# Patient Record
Sex: Female | Born: 1970 | Race: White | Hispanic: No | Marital: Single | Smoking: Former smoker
Health system: Southern US, Community
[De-identification: ages and names within clinical notes are randomized; demographics above are authoritative.]

## PROBLEM LIST (undated history)

## (undated) DIAGNOSIS — N926 Irregular menstruation, unspecified: Secondary | ICD-10-CM

## (undated) DIAGNOSIS — Z8679 Personal history of other diseases of the circulatory system: Secondary | ICD-10-CM

## (undated) DIAGNOSIS — J302 Other seasonal allergic rhinitis: Secondary | ICD-10-CM

## (undated) HISTORY — DX: Other seasonal allergic rhinitis: J30.2

## (undated) HISTORY — PX: LAPAROSCOPY: SHX197

## (undated) HISTORY — DX: Personal history of other diseases of the circulatory system: Z86.79

## (undated) HISTORY — DX: Irregular menstruation, unspecified: N92.6

## (undated) HISTORY — PX: OTHER SURGICAL HISTORY: SHX169

---

## 2004-11-15 ENCOUNTER — Emergency Department (HOSPITAL_COMMUNITY): Admission: EM | Admit: 2004-11-15 | Discharge: 2004-11-16 | Payer: Self-pay | Admitting: *Deleted

## 2006-10-26 ENCOUNTER — Ambulatory Visit (HOSPITAL_COMMUNITY): Admission: RE | Admit: 2006-10-26 | Discharge: 2006-10-26 | Payer: Self-pay | Admitting: Family Medicine

## 2006-10-26 IMAGING — US US OB DETAIL+14 WK
1 series · 14 of 28 positions shown · non-contrast
Comparison: none

OBSTETRICAL ULTRASOUND:
 This ultrasound was performed in The [HOSPITAL], and the [facility] report will be stored to [REDACTED] PACS.

[Series 1: us ob detail+14 wk · 14 of 37 slices shown]
[im 2/37]
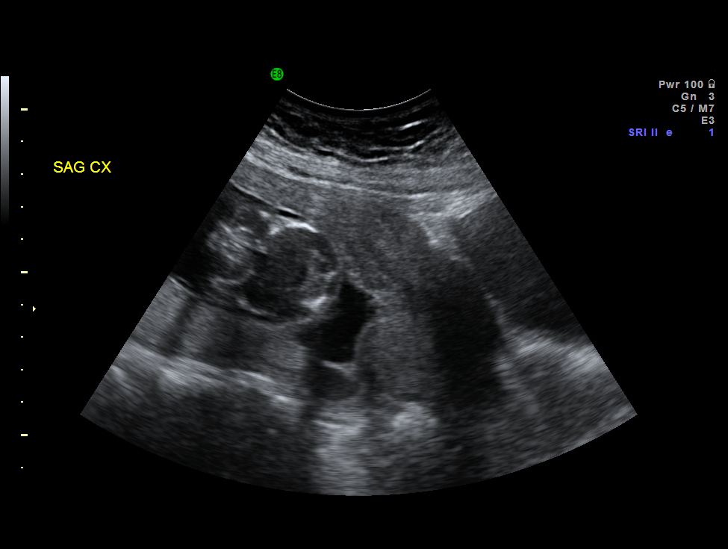
[im 5/37]
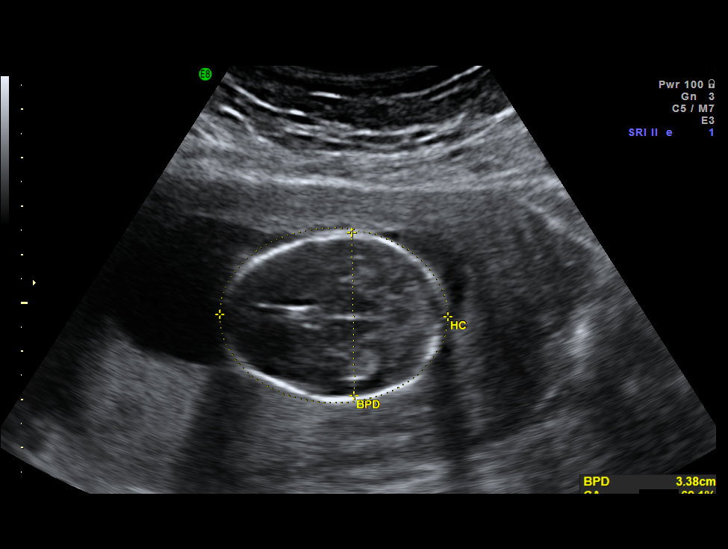
[im 7/37]
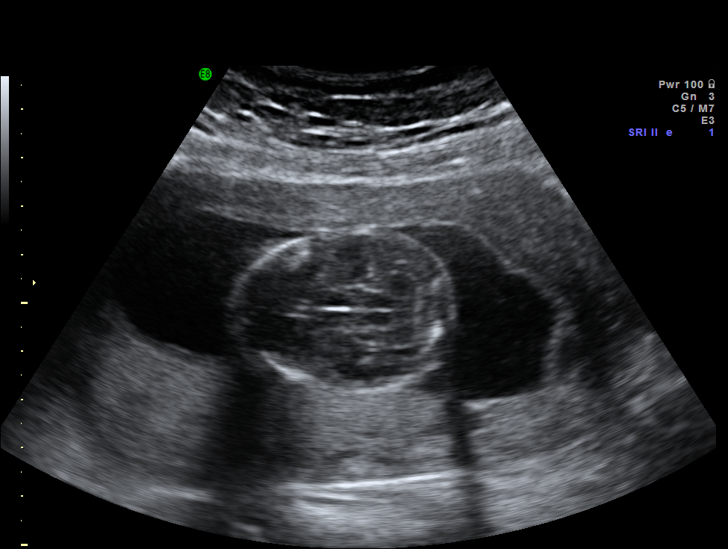
[im 10/37]
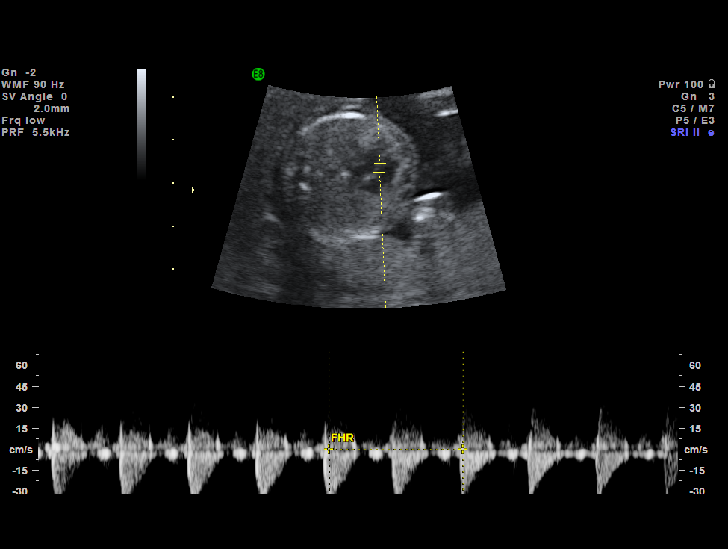
[im 13/37]
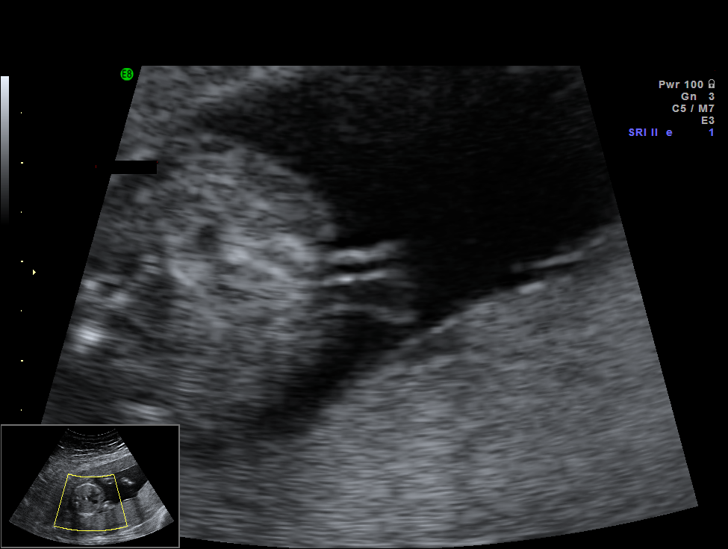
[im 15/37]
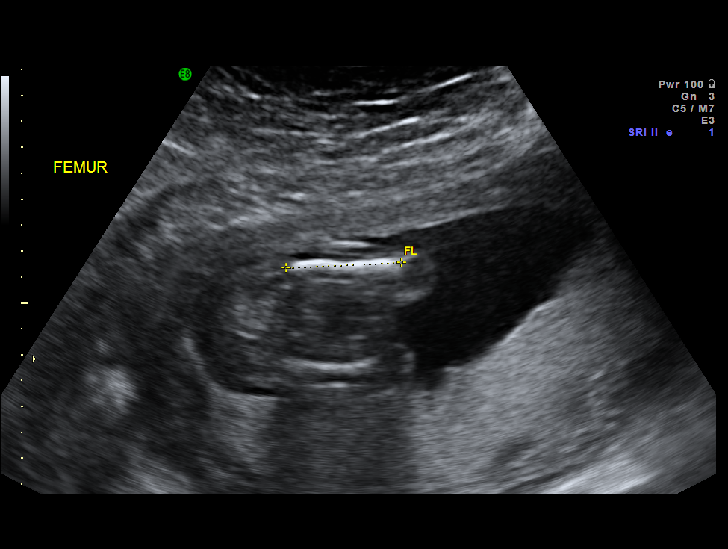
[im 18/37]
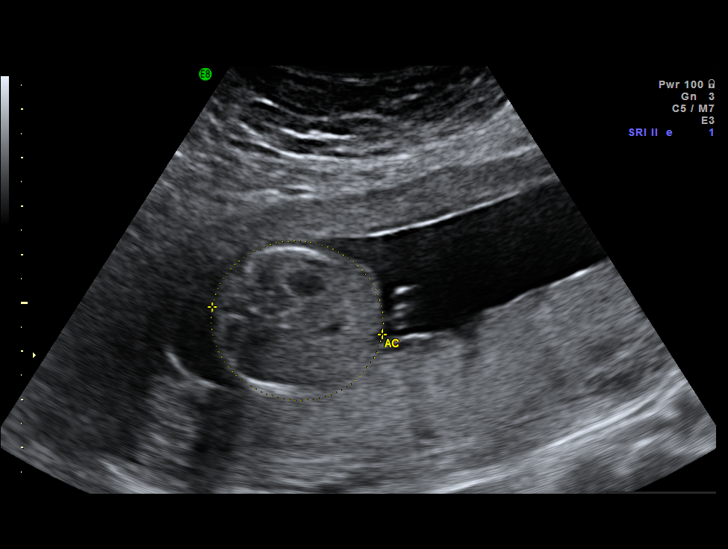
[im 21/37]
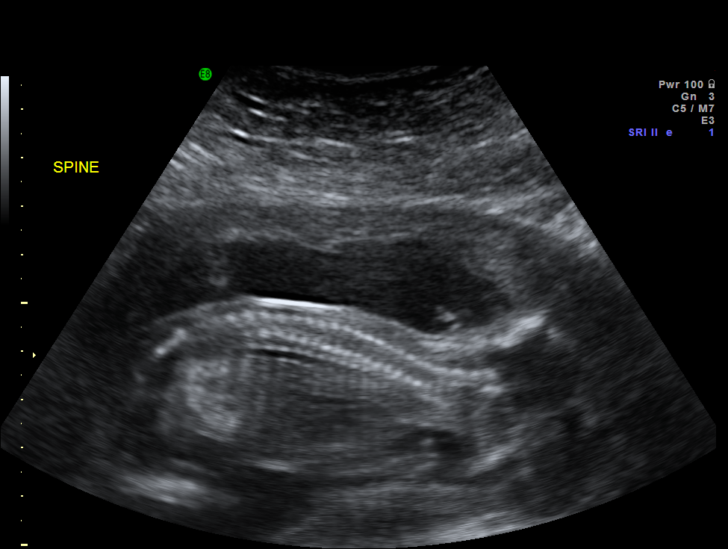
[im 23/37]
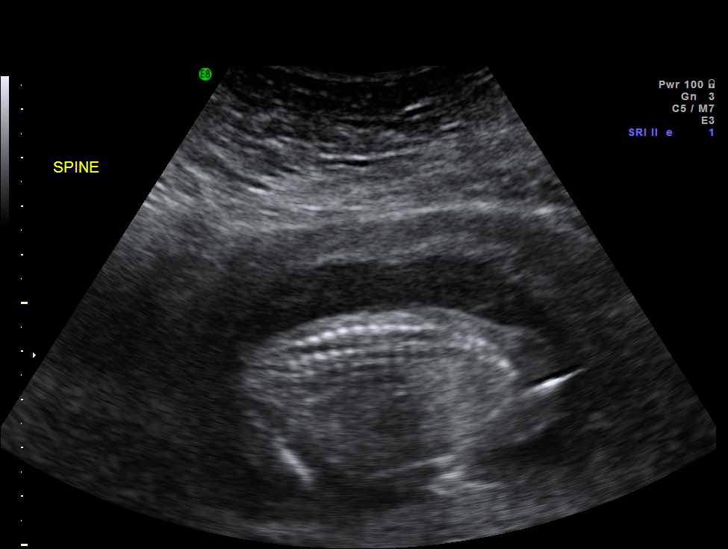
[im 26/37]
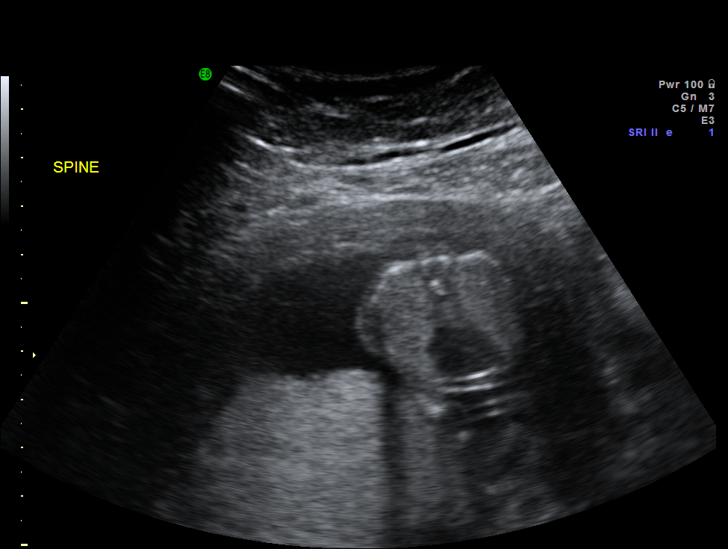
[im 29/37]
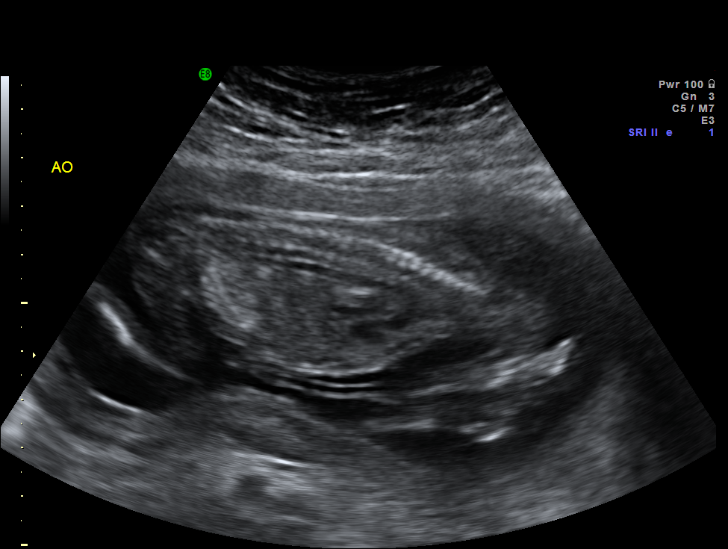
[im 31/37]
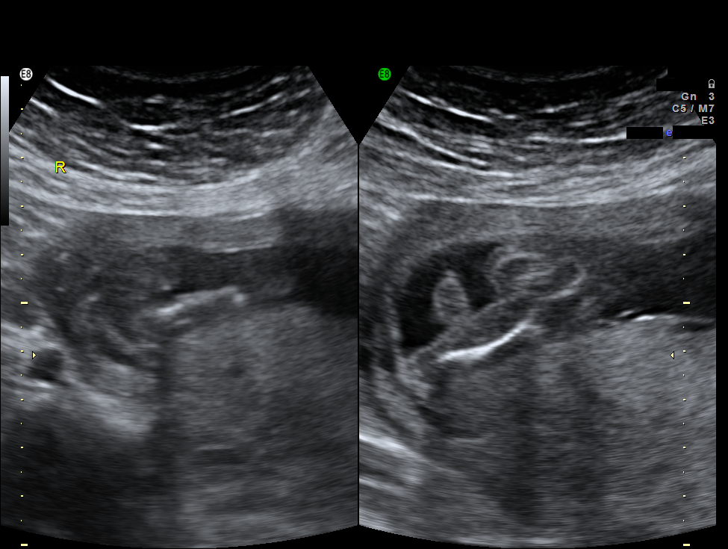
[im 34/37]
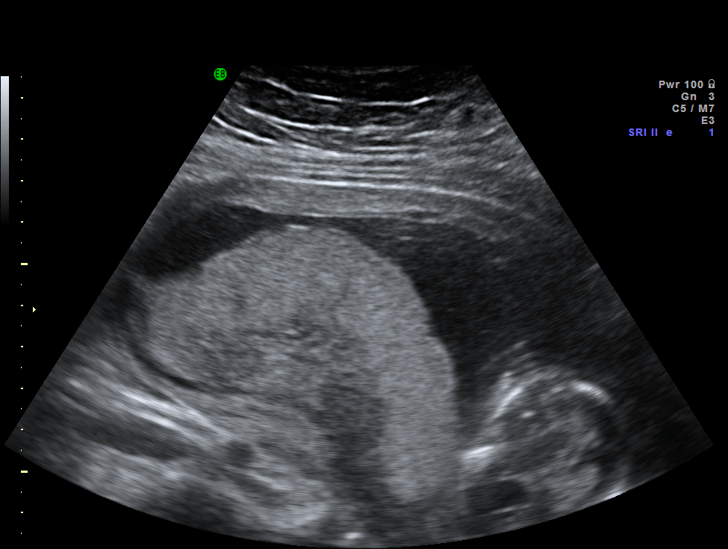
[im 37/37]
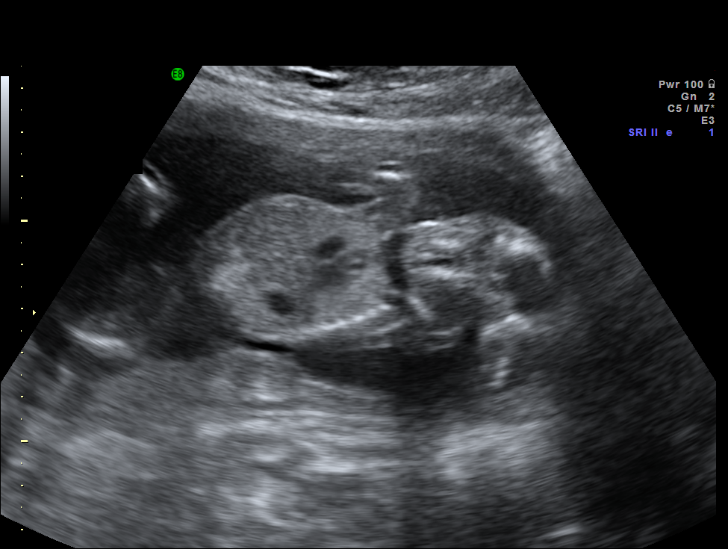

[14 of 28 positions shown; findings below may reference images not displayed]

IMPRESSION: The [facility] report has also been faxed to the ordering physician.

## 2006-11-16 ENCOUNTER — Ambulatory Visit (HOSPITAL_COMMUNITY): Admission: RE | Admit: 2006-11-16 | Discharge: 2006-11-16 | Payer: Self-pay | Admitting: Family Medicine

## 2006-11-16 IMAGING — US US OB FOLLOW-UP
1 series · 14 of 28 positions shown · non-contrast
Comparison: none

OBSTETRICAL ULTRASOUND:
 This ultrasound was performed in The [HOSPITAL], and the [facility] report will be stored to [REDACTED] PACS.

[Series 1: us ob follow-up · 14 of 96 slices shown]
[im 4/96]
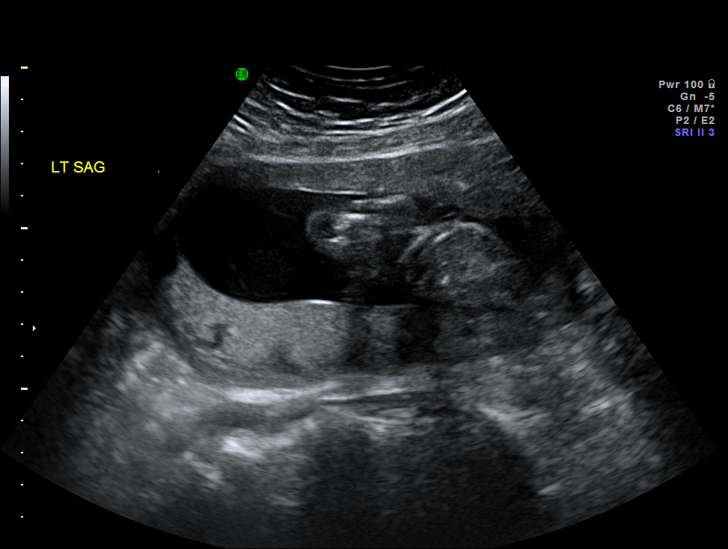
[im 11/96]
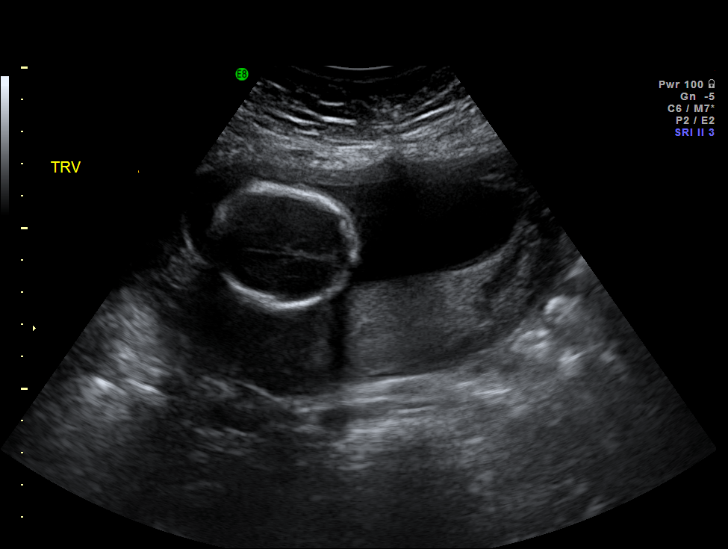
[im 18/96]
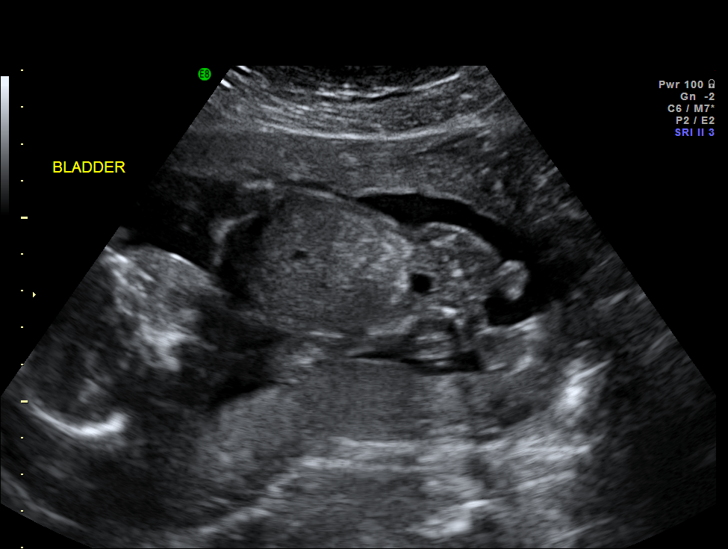
[im 25/96]
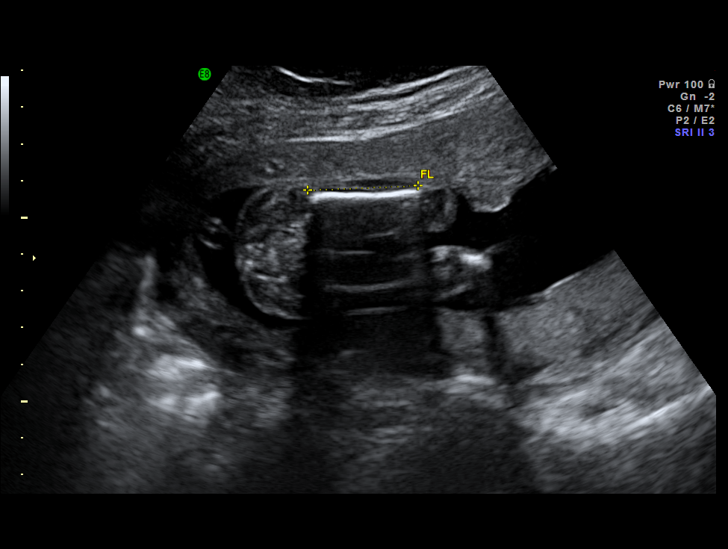
[im 32/96]
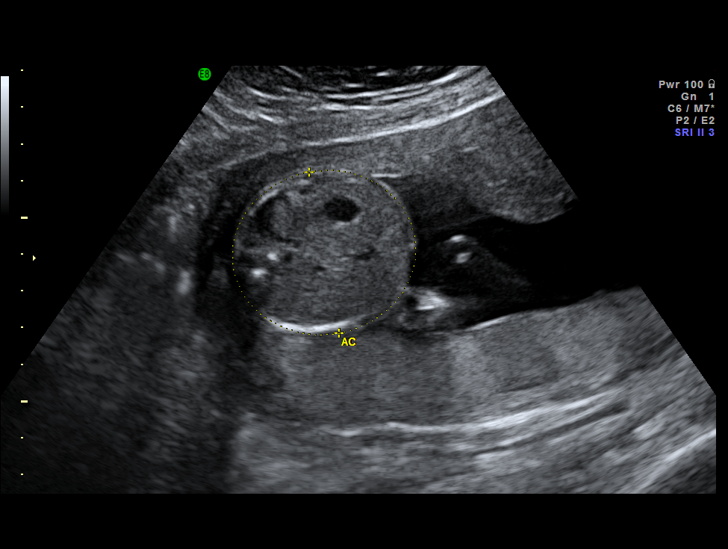
[im 39/96]
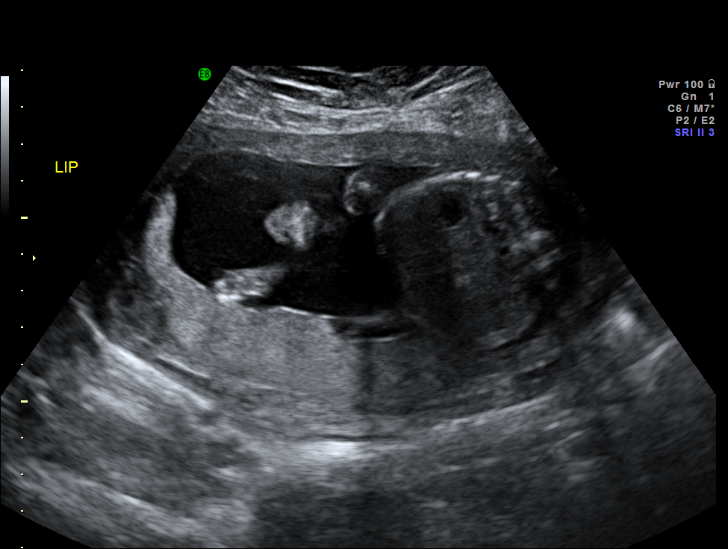
[im 46/96]
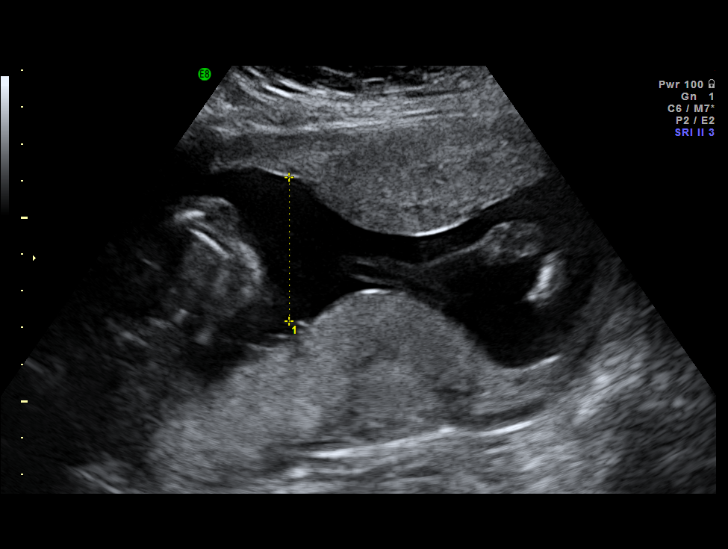
[im 53/96]
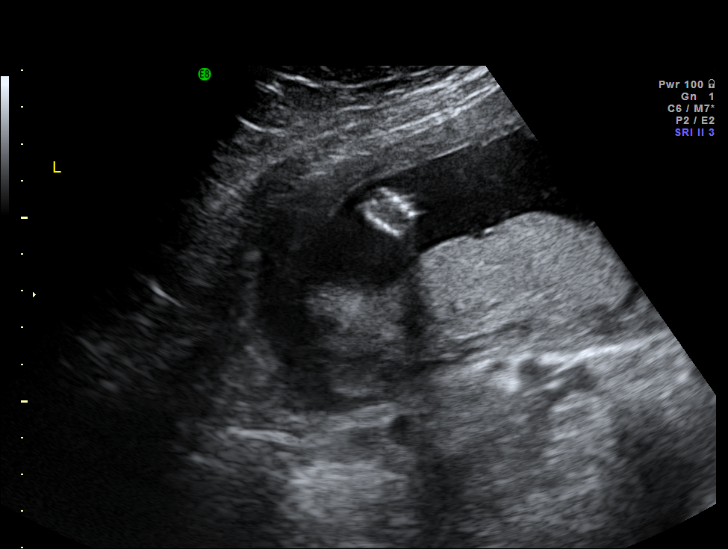
[im 60/96]
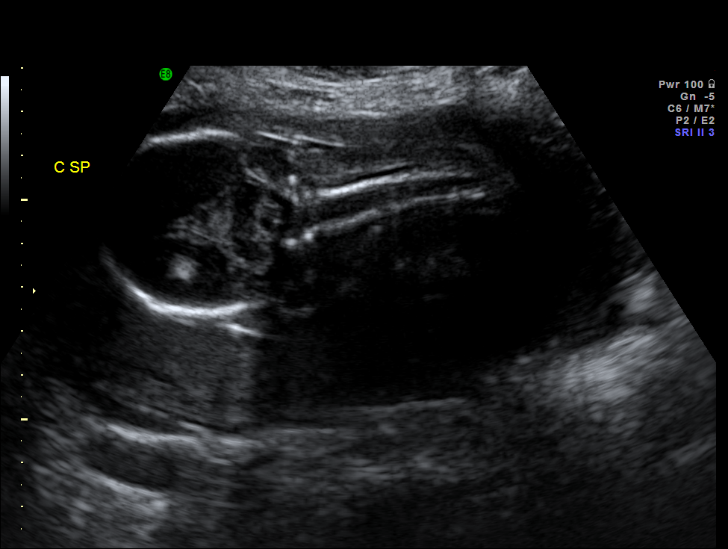
[im 67/96]
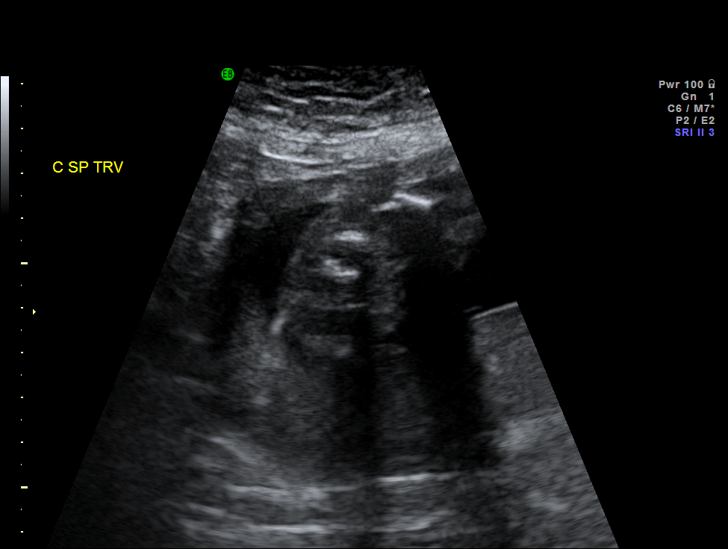
[im 74/96]
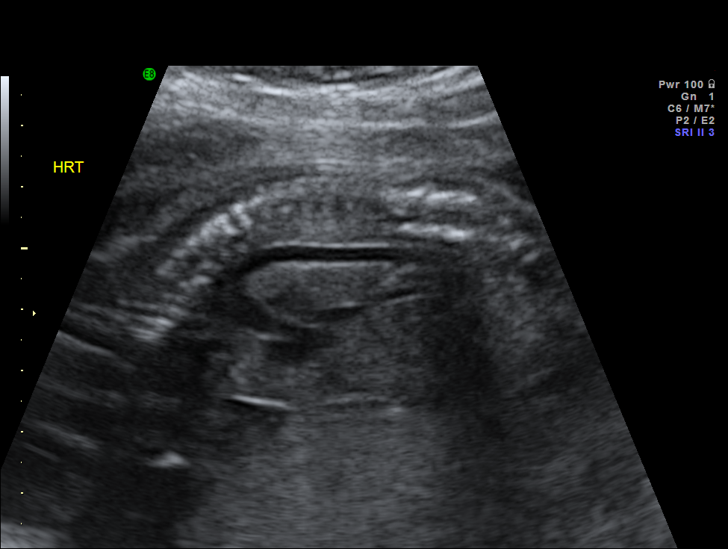
[im 81/96]
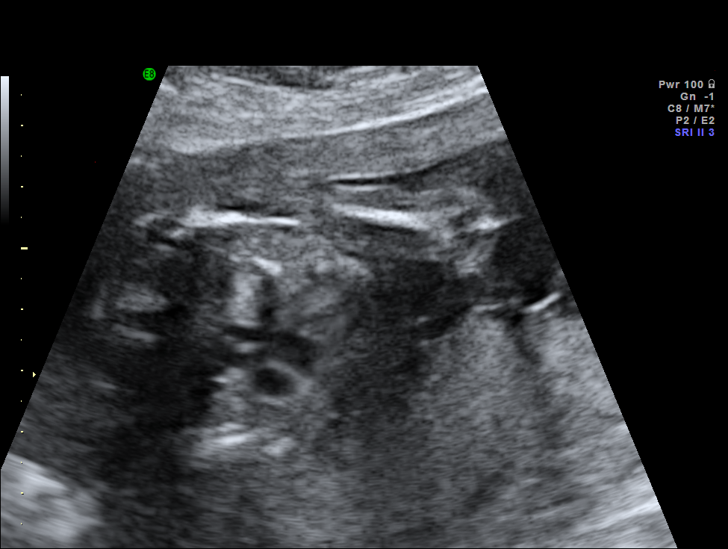
[im 88/96]
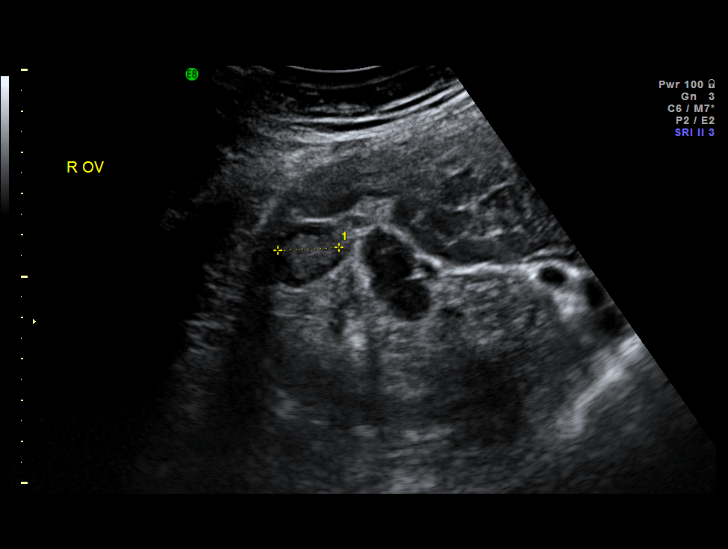
[im 96/96]
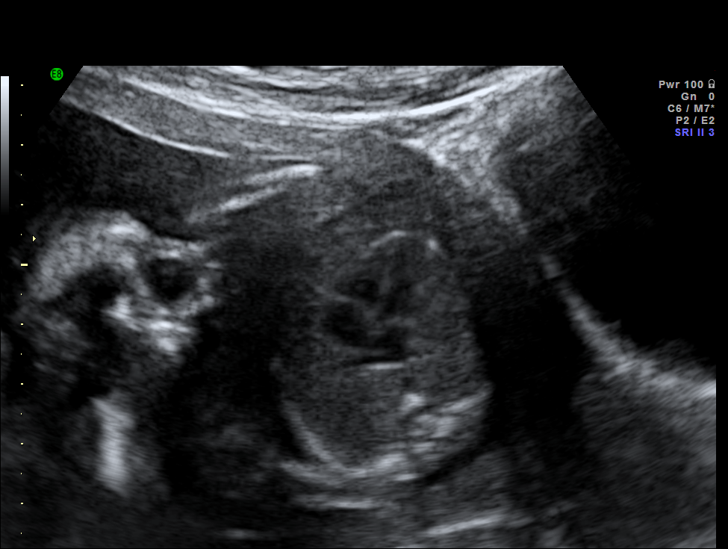

[14 of 28 positions shown; findings below may reference images not displayed]

IMPRESSION: The [facility] report has also been faxed to the ordering physician.

## 2006-12-14 ENCOUNTER — Ambulatory Visit (HOSPITAL_COMMUNITY): Admission: RE | Admit: 2006-12-14 | Discharge: 2006-12-14 | Payer: Self-pay | Admitting: Family Medicine

## 2006-12-14 IMAGING — US US OB FOLLOW-UP
1 series · 14 of 28 positions shown · non-contrast
Comparison: none

OBSTETRICAL ULTRASOUND:
 This ultrasound was performed in The [HOSPITAL], and the [facility] report will be stored to [REDACTED] PACS.

[Series 1: us ob follow-up · 14 of 37 slices shown]
[im 2/37]
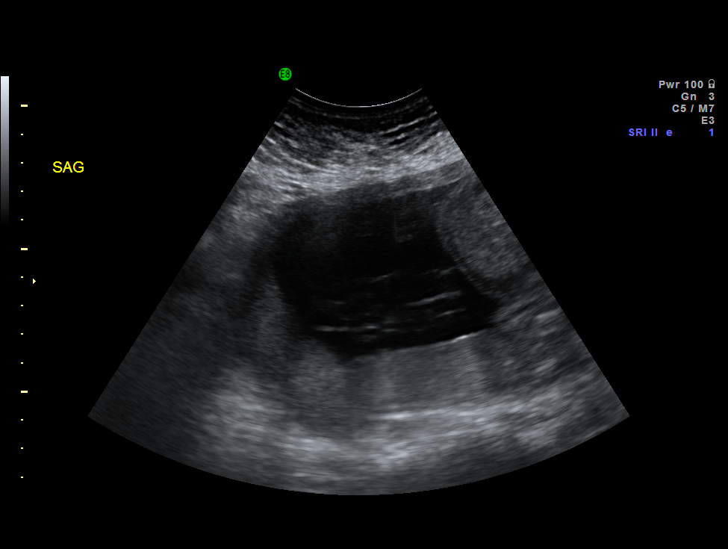
[im 5/37]
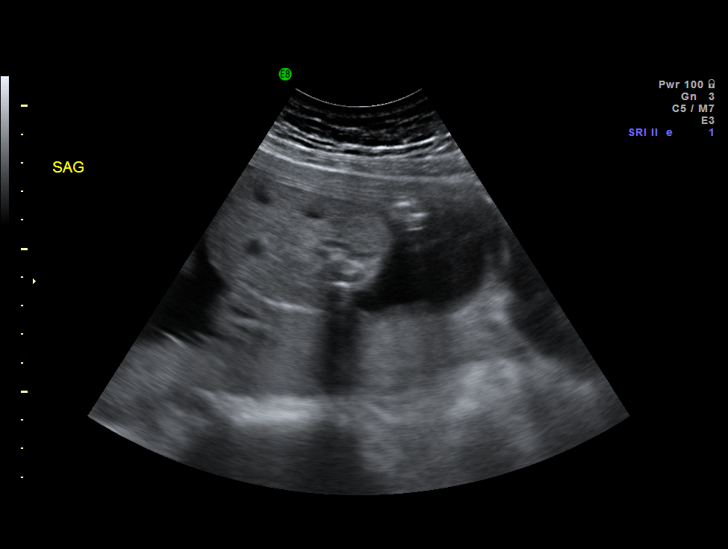
[im 7/37]
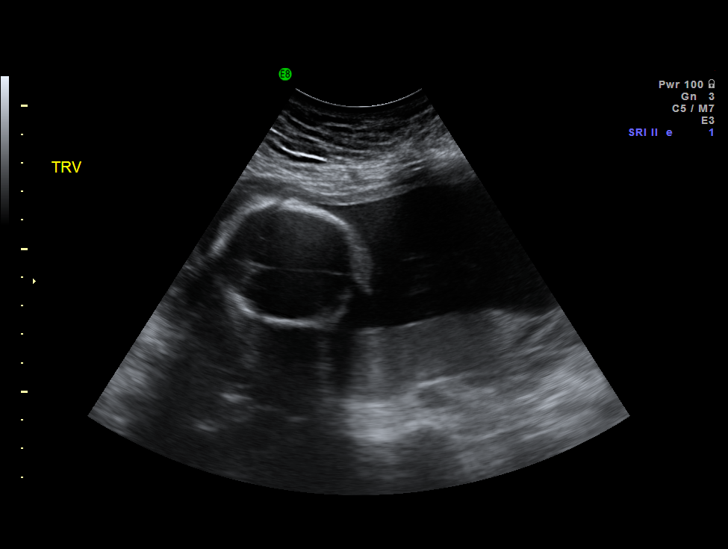
[im 10/37]
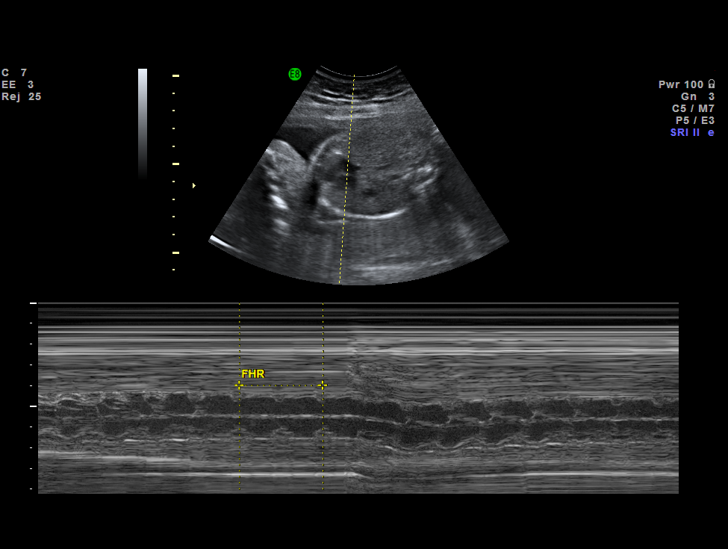
[im 13/37]
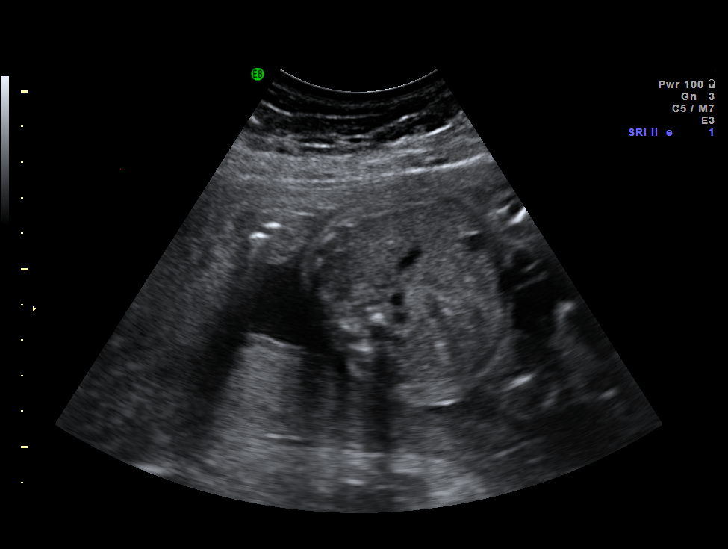
[im 15/37]
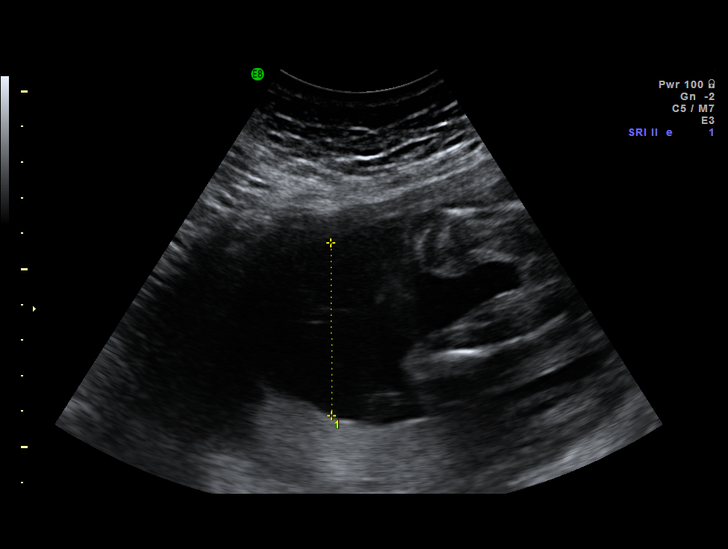
[im 18/37]
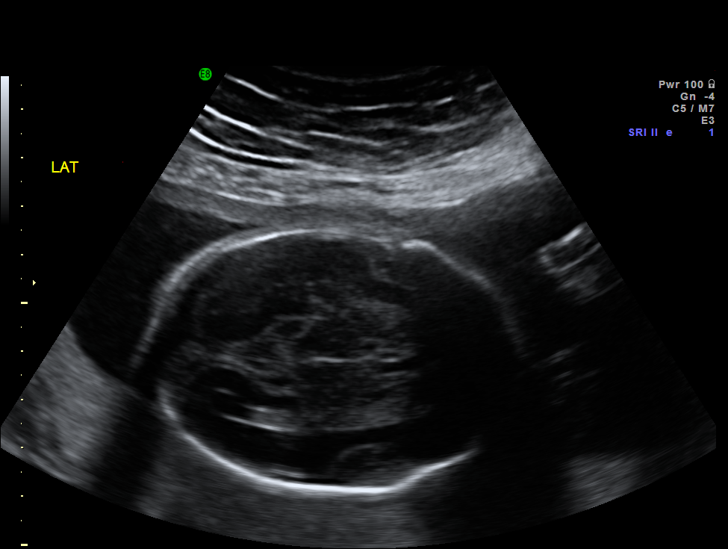
[im 21/37]
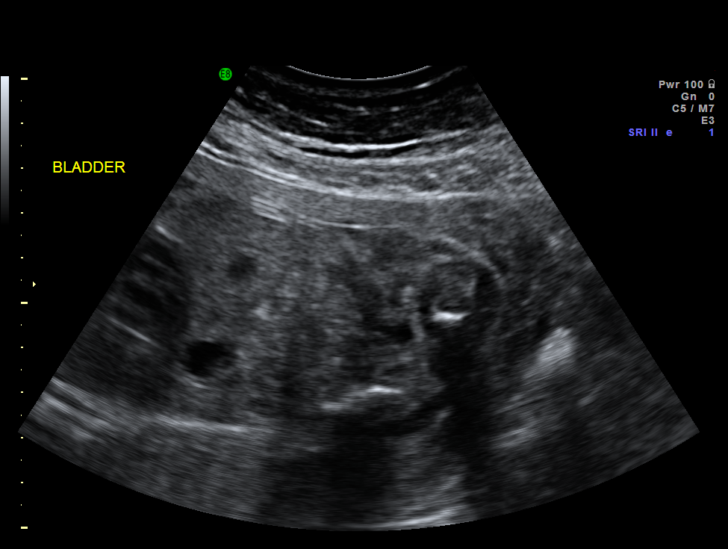
[im 23/37]
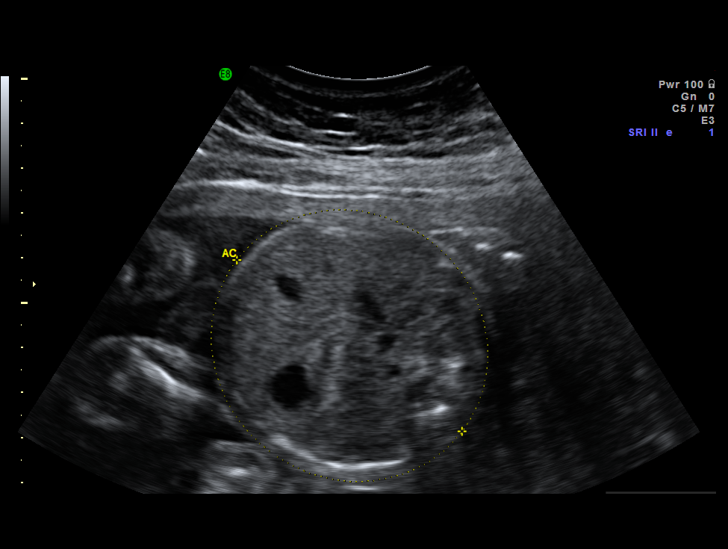
[im 26/37]
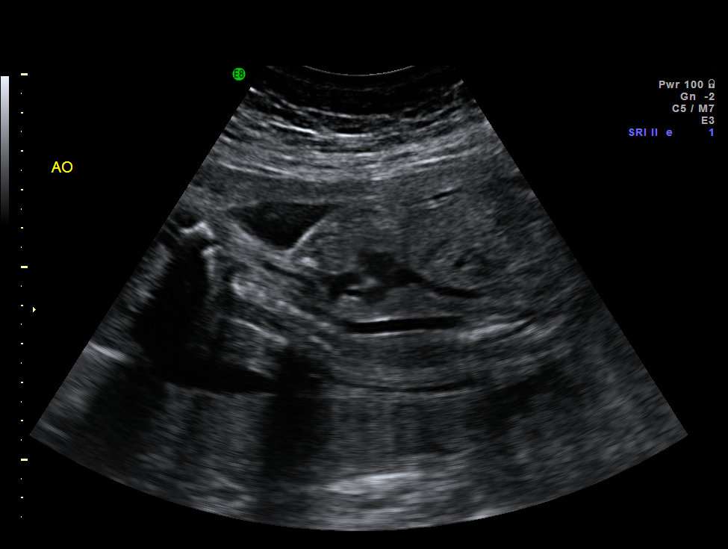
[im 29/37]
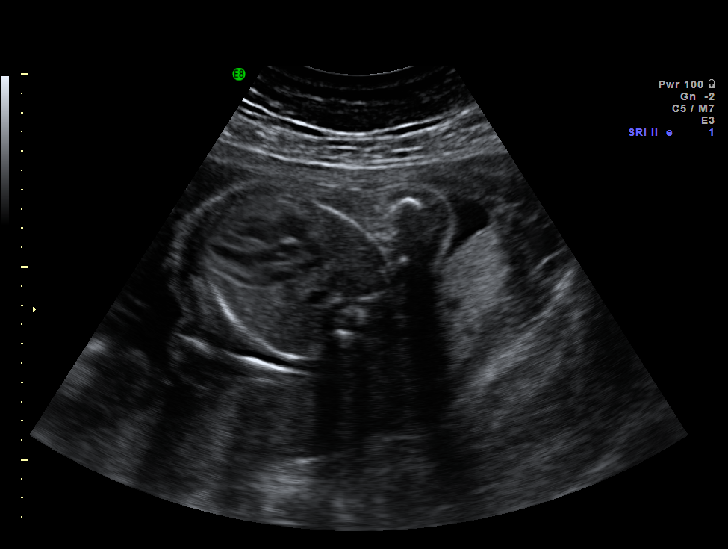
[im 31/37]
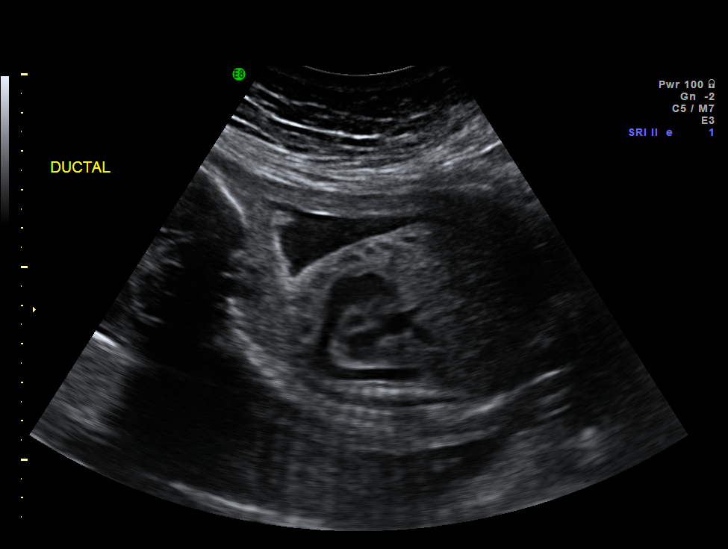
[im 34/37]
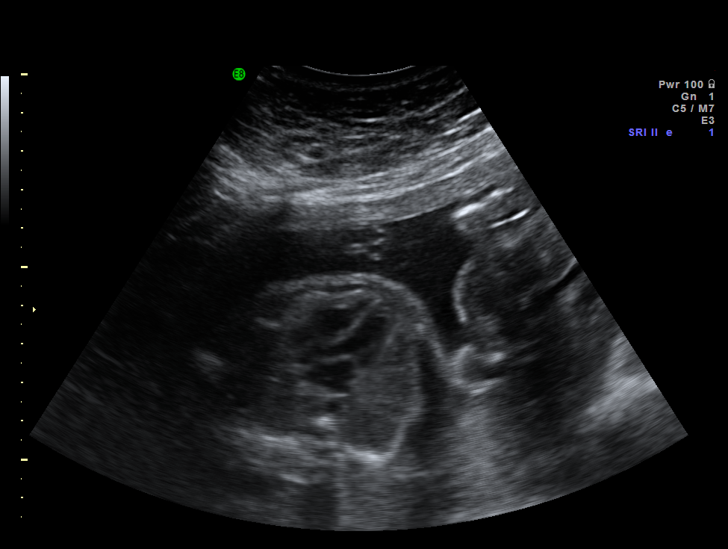
[im 37/37]
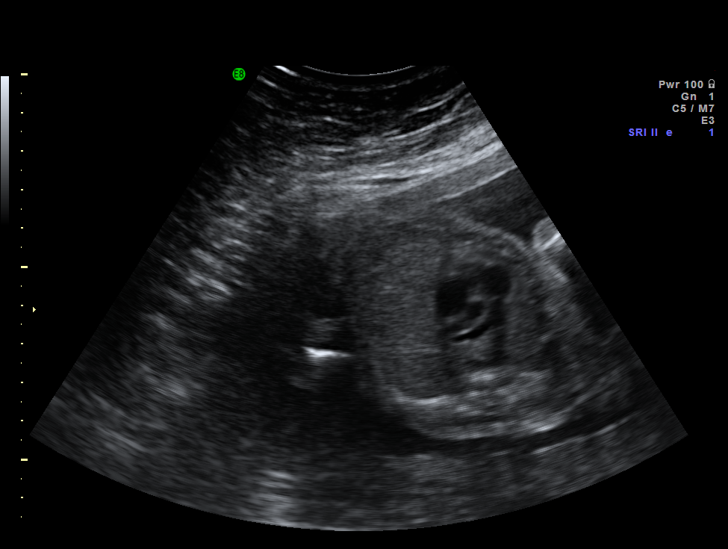

[14 of 28 positions shown; findings below may reference images not displayed]

IMPRESSION: The [facility] report has also been faxed to the ordering physician.

## 2007-01-11 ENCOUNTER — Ambulatory Visit (HOSPITAL_COMMUNITY): Admission: RE | Admit: 2007-01-11 | Discharge: 2007-01-11 | Payer: Self-pay | Admitting: Family Medicine

## 2007-01-11 IMAGING — US US OB FOLLOW-UP
1 series · 14 of 28 positions shown · non-contrast
Comparison: none

OBSTETRICAL ULTRASOUND:
 This ultrasound was performed in The [HOSPITAL], and the [facility] report will be stored to [REDACTED] PACS.

[Series 1: us ob follow-up · 14 of 35 slices shown]
[im 2/35]
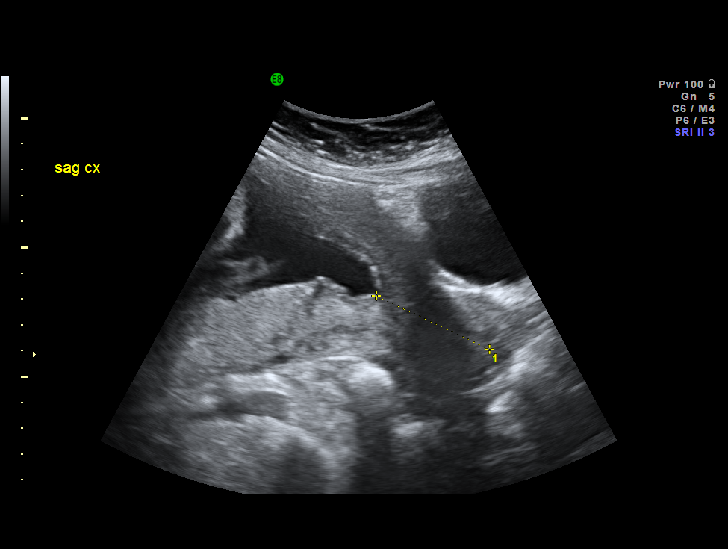
[im 4/35]
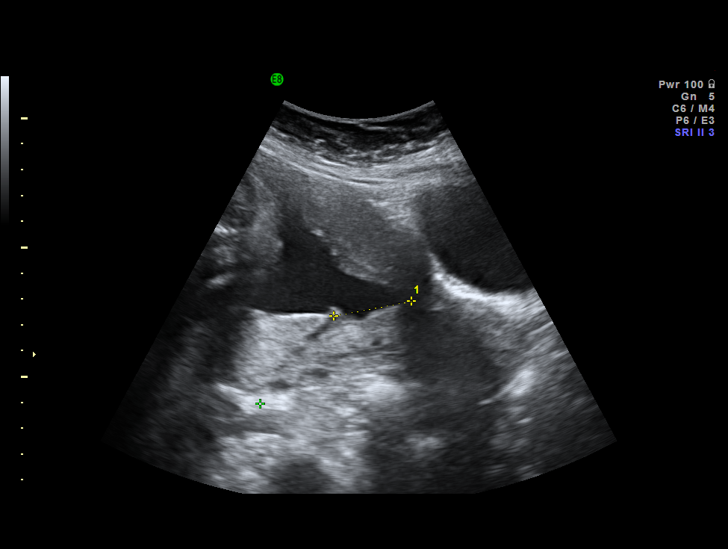
[im 7/35]
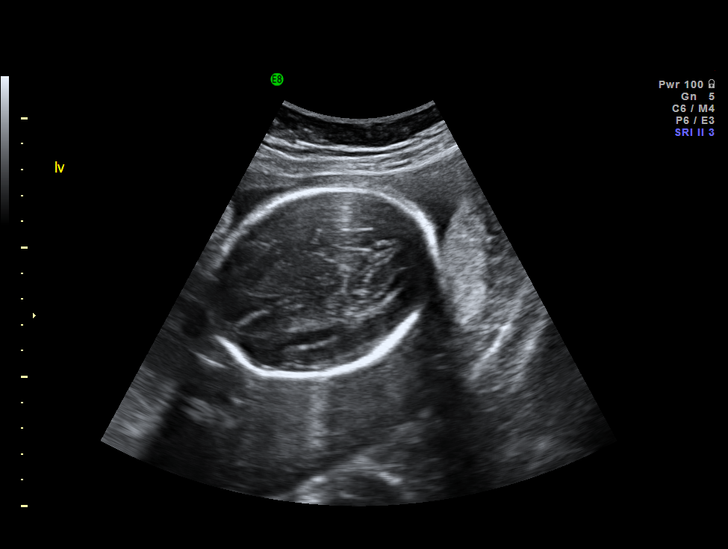
[im 9/35]
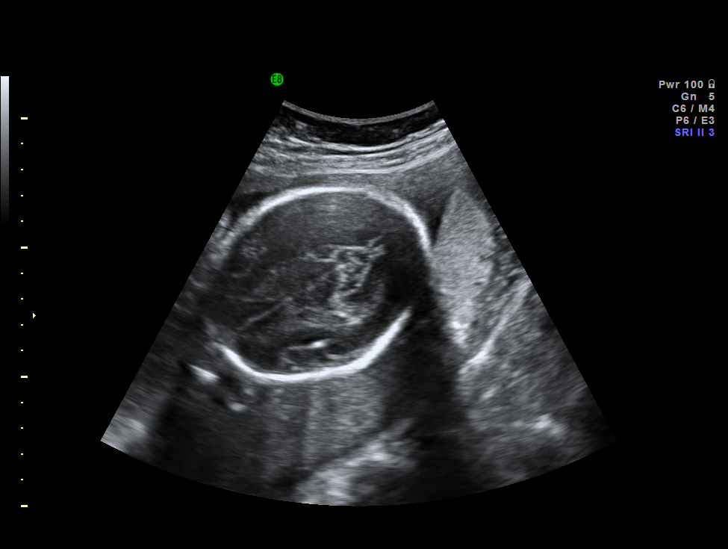
[im 12/35]
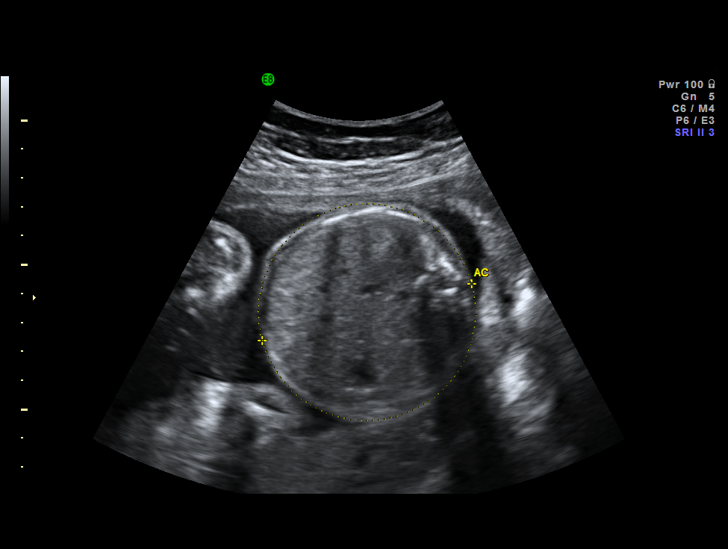
[im 14/35]
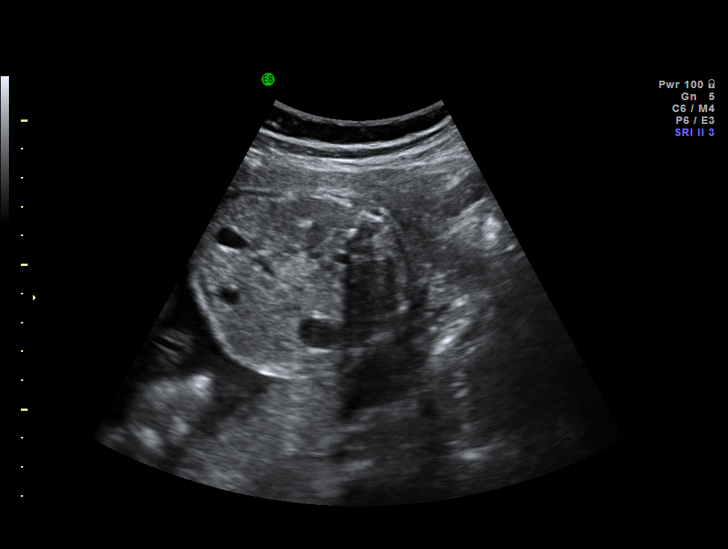
[im 17/35]
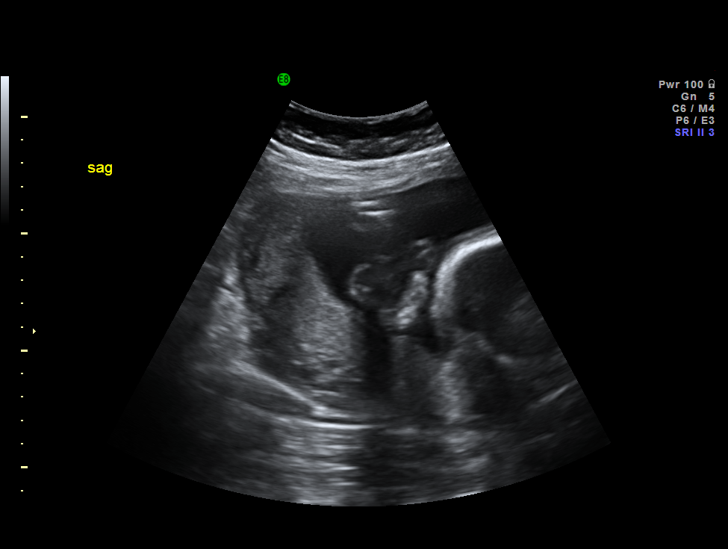
[im 19/35]
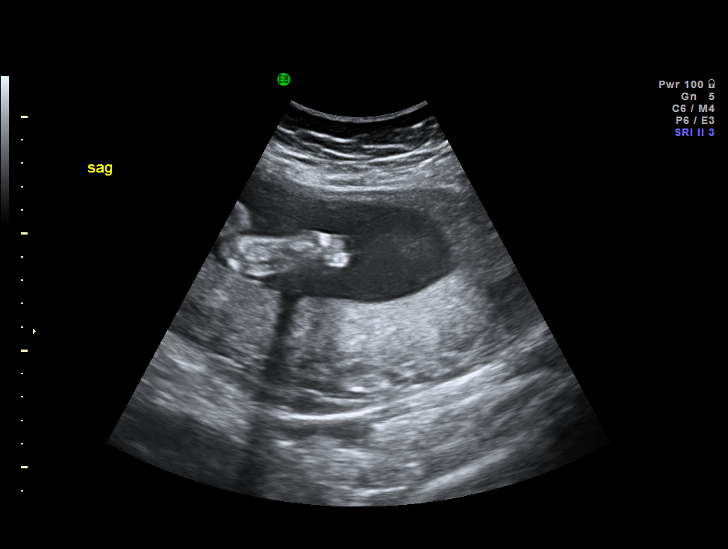
[im 22/35]
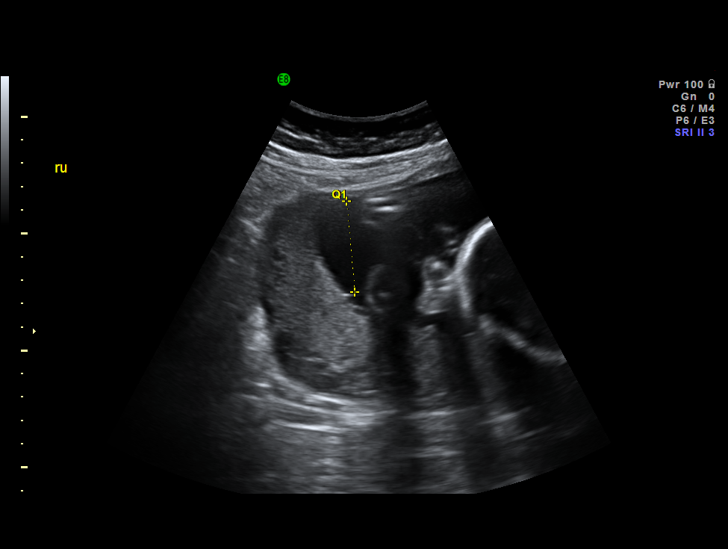
[im 24/35]
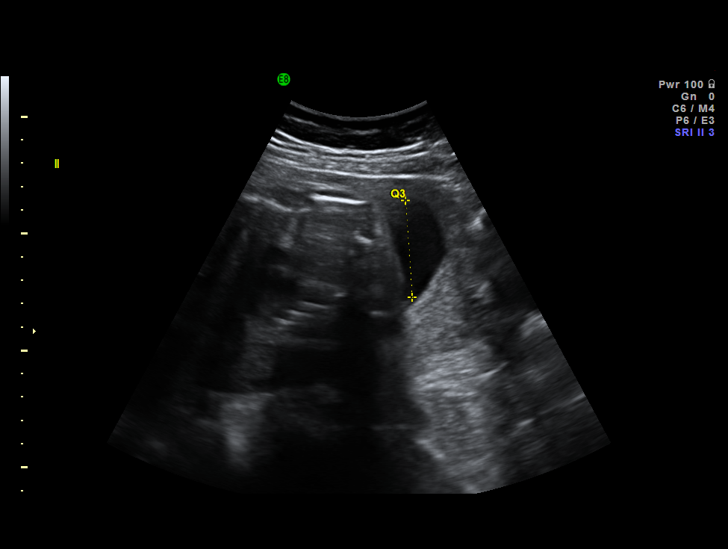
[im 27/35]
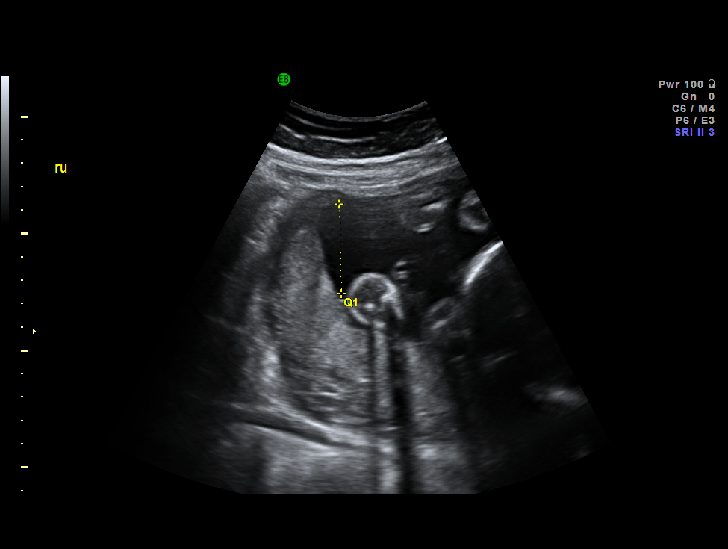
[im 29/35]
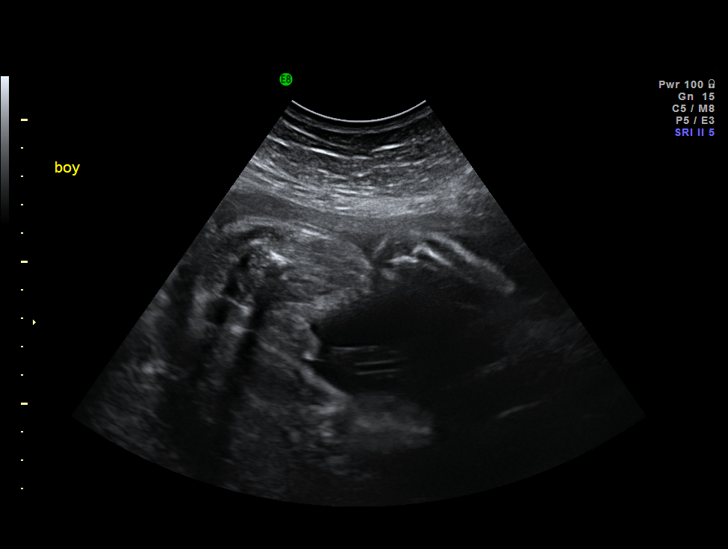
[im 32/35]
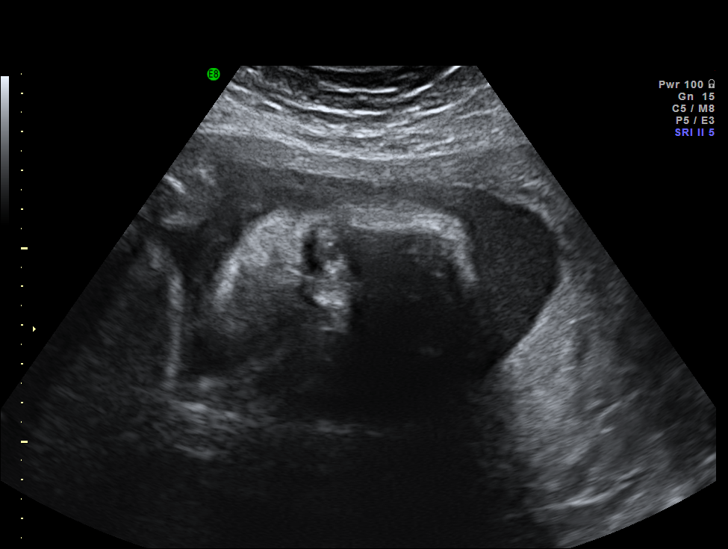
[im 35/35]
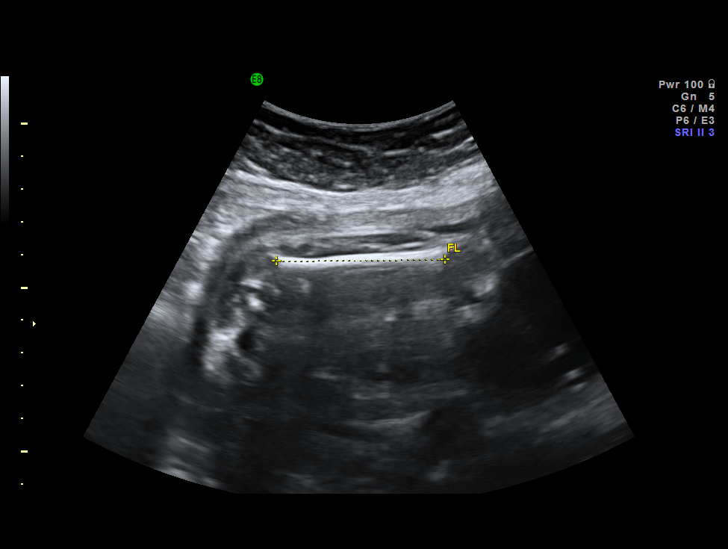

[14 of 28 positions shown; findings below may reference images not displayed]

IMPRESSION: The [facility] report has also been faxed to the ordering physician.

## 2007-01-12 ENCOUNTER — Ambulatory Visit: Payer: Self-pay | Admitting: Cardiology

## 2007-01-26 ENCOUNTER — Ambulatory Visit: Payer: Self-pay

## 2007-01-26 ENCOUNTER — Encounter: Payer: Self-pay | Admitting: Cardiology

## 2007-01-28 ENCOUNTER — Ambulatory Visit: Payer: Self-pay | Admitting: Obstetrics and Gynecology

## 2007-01-28 ENCOUNTER — Inpatient Hospital Stay (HOSPITAL_COMMUNITY): Admission: AD | Admit: 2007-01-28 | Discharge: 2007-01-28 | Payer: Self-pay | Admitting: Obstetrics and Gynecology

## 2007-04-12 ENCOUNTER — Ambulatory Visit (HOSPITAL_COMMUNITY): Admission: RE | Admit: 2007-04-12 | Discharge: 2007-04-12 | Payer: Self-pay | Admitting: Obstetrics & Gynecology

## 2007-04-12 IMAGING — US US FETAL BPP W/O NONSTRESS
1 series · 14 of 21 positions shown · non-contrast
Comparison: none

OBSTETRICAL ULTRASOUND:

 This ultrasound exam was performed in the [HOSPITAL] Ultrasound Department.  The OB US report was generated in the AS system, and faxed to the ordering physician.  This report is also available in [REDACTED] PACS.

[Series 1: us fetal bpp w/o nonstress · non-contrast · 14 of 21 slices shown]
[im 1/21]
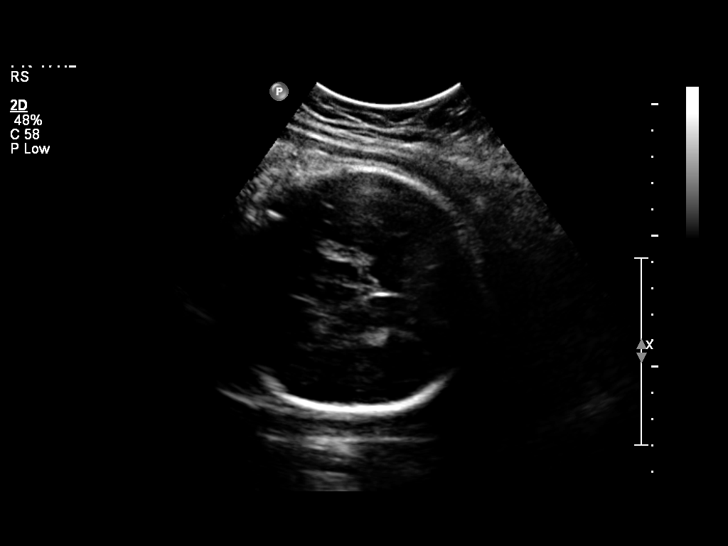
[im 3/21]
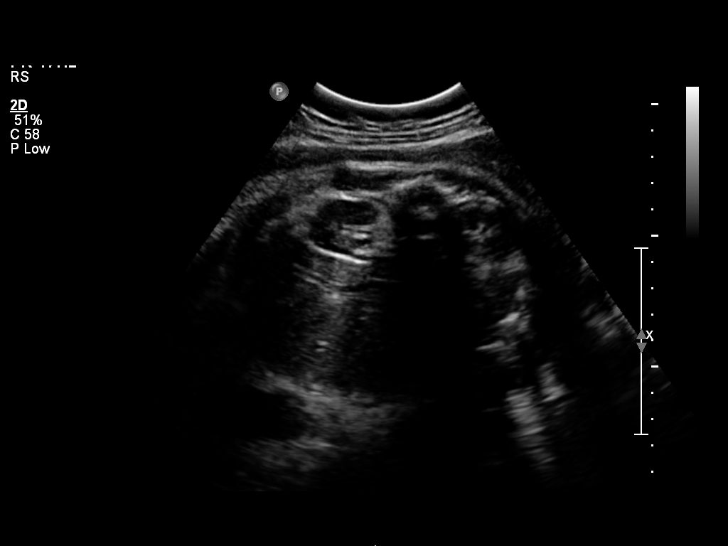
[im 4/21]
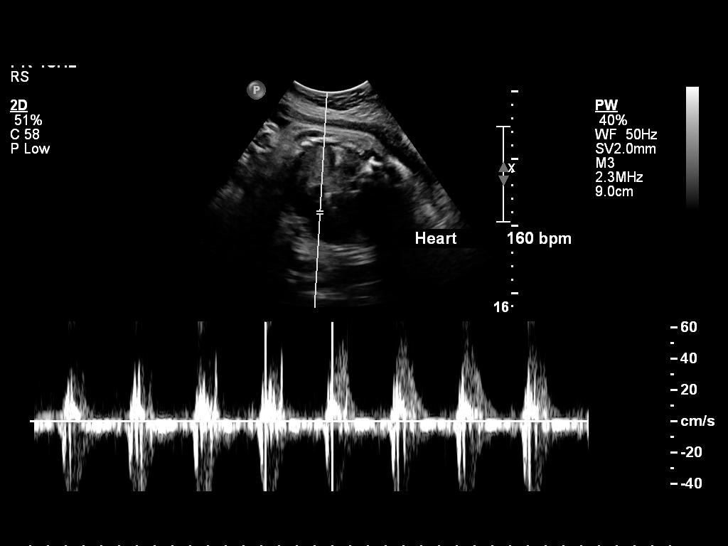
[im 6/21]
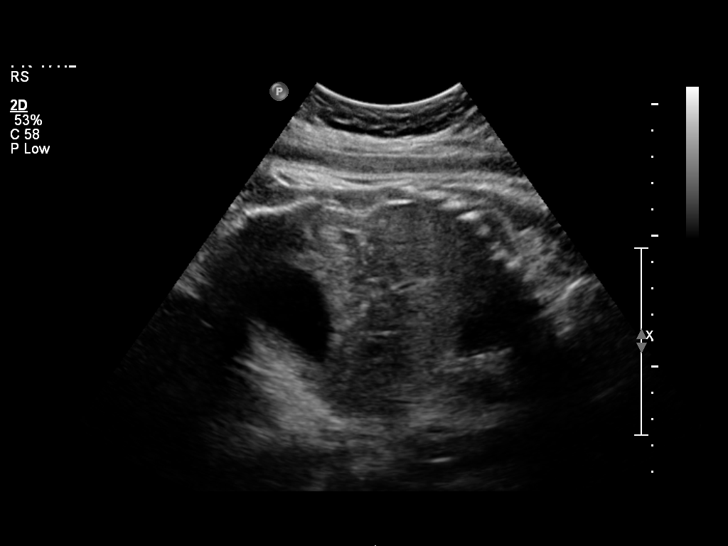
[im 7/21]
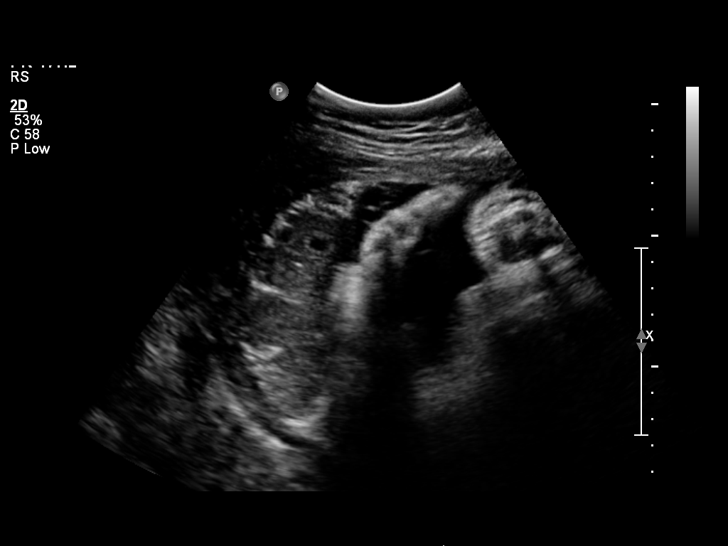
[im 9/21]
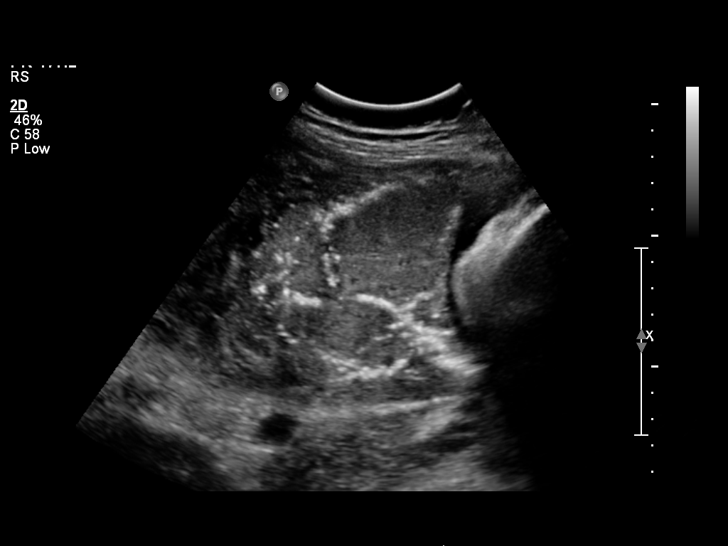
[im 10/21]
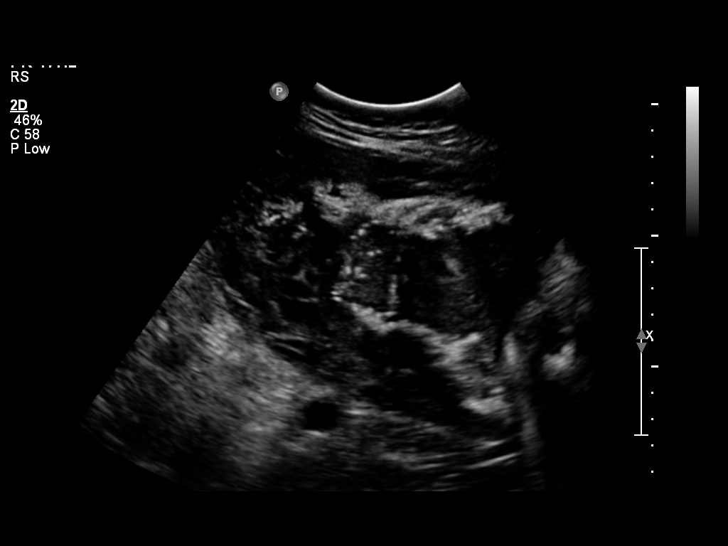
[im 12/21]
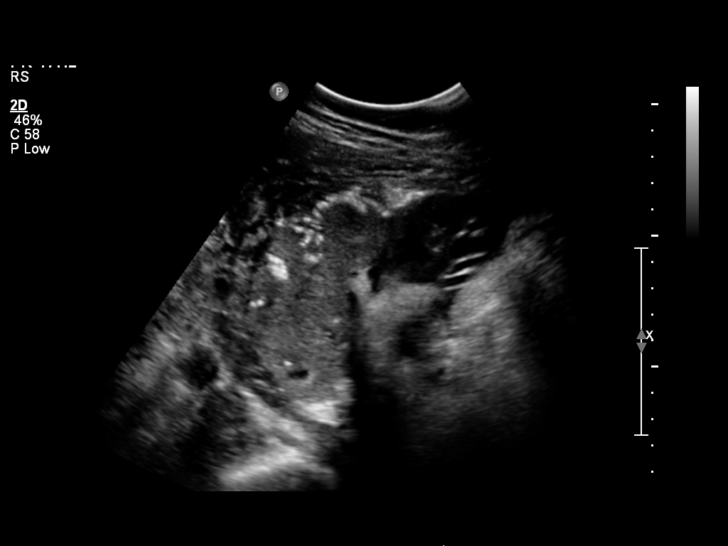
[im 13/21]
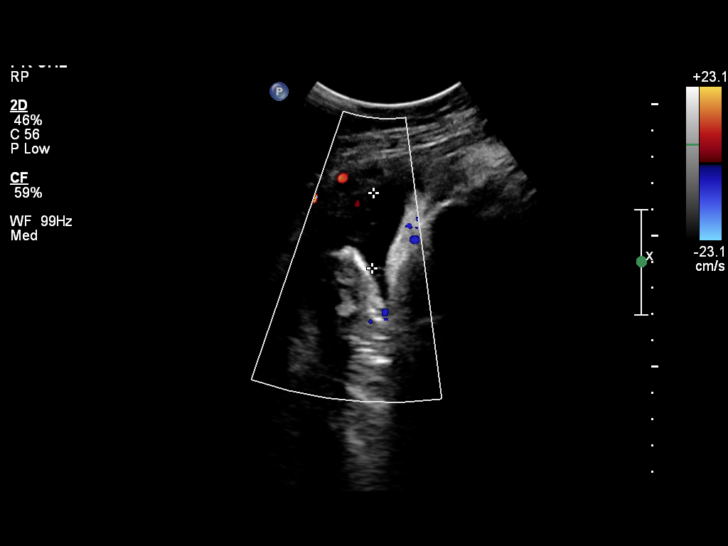
[im 15/21]
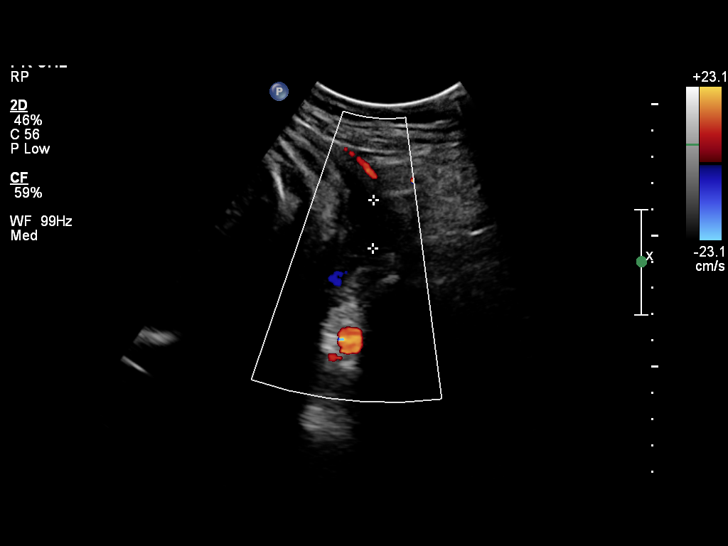
[im 16/21]
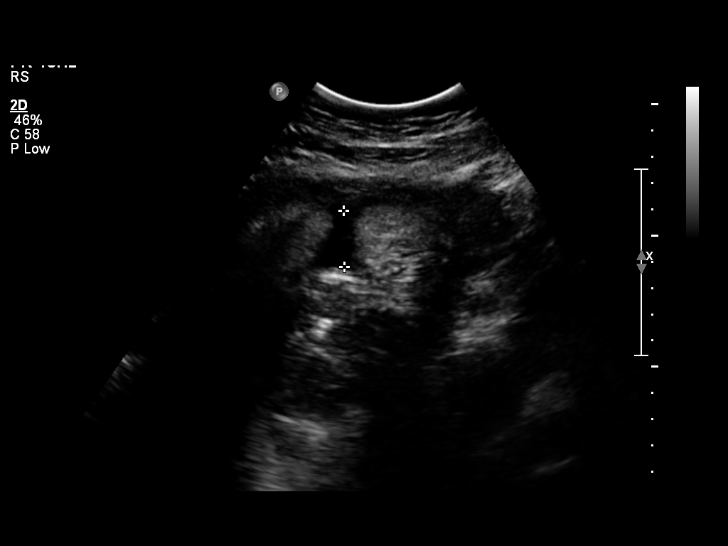
[im 18/21]
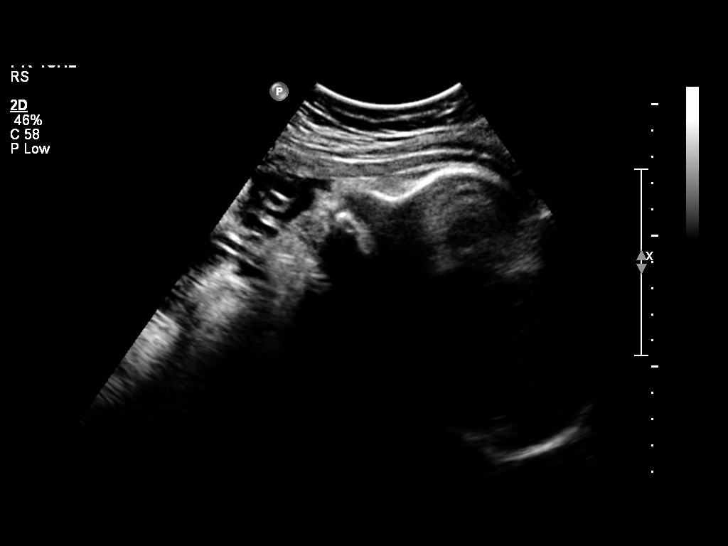
[im 19/21]
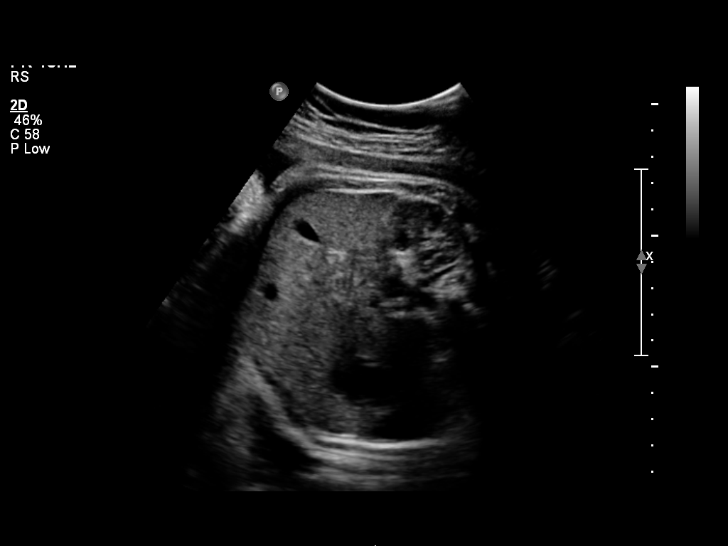
[im 21/21]
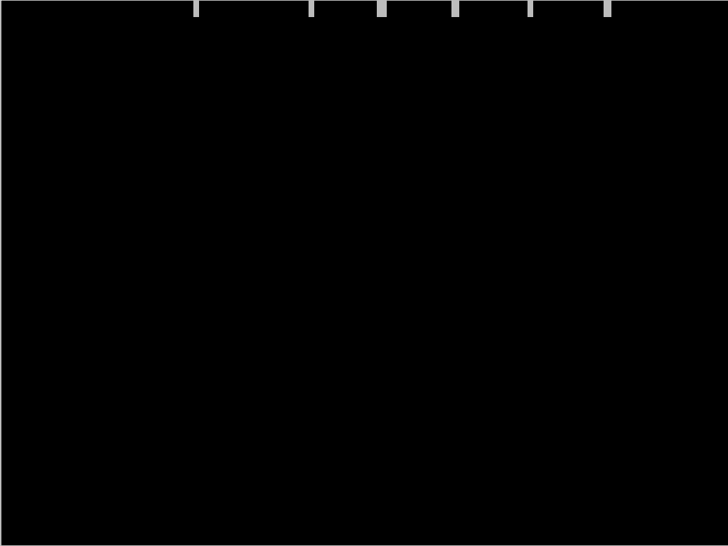

[14 of 21 positions shown; findings below may reference images not displayed]

IMPRESSION: See [facility] Obstetric US report.

## 2007-04-16 ENCOUNTER — Encounter: Payer: Self-pay | Admitting: Obstetrics and Gynecology

## 2007-04-16 ENCOUNTER — Inpatient Hospital Stay (HOSPITAL_COMMUNITY): Admission: AD | Admit: 2007-04-16 | Discharge: 2007-04-16 | Payer: Self-pay | Admitting: Obstetrics & Gynecology

## 2007-04-16 ENCOUNTER — Ambulatory Visit: Payer: Self-pay | Admitting: Obstetrics and Gynecology

## 2007-04-16 ENCOUNTER — Inpatient Hospital Stay (HOSPITAL_COMMUNITY): Admission: AD | Admit: 2007-04-16 | Discharge: 2007-04-19 | Payer: Self-pay | Admitting: Obstetrics and Gynecology
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
## 2010-09-15 NOTE — Discharge Summary (Signed)
[name], MICKEL              ACCOUNT NO.:  [id number]   MEDICAL RECORD NO.:  [id number]          PATIENT TYPE:  INP   LOCATION:  9127                          FACILITY:  [facility]   PHYSICIAN:  Allie Bossier, MD        DATE OF BIRTH:  05/12/1970   DATE OF ADMISSION:  04/16/2007  DATE OF DISCHARGE:  04/19/2007                               DISCHARGE SUMMARY   REASON FOR ADMISSION:  Onset of labor.   DISCHARGE MEDICATIONS:  1. Ibuprofen 600 mg q. 6 hours p.r.n. pain.  2. Percocet 5/325 one tab every 4 hours as needed for pain.  3. Colace 100 mg one tab daily for constipation.  4. Prenatal vitamins one tab daily while breast-feeding.  5. Ferrous sulfate 325 mg one tab twice daily.   DISCHARGE DIAGNOSES:  1. Term pregnancy, delivered.  2. Partial placental abruption.   HOSPITAL COURSE:  Ms. Broshears was a 40 year old G2, P0, now P1, who was  admitted for spontaneous onset of labor.  It was planned to do a normal  vaginal delivery.  However, at approximately 7:45 p.m. on December 14,  patient began to pass blood and clots per her vagina and baby was found  to have severe variable decelerations.  It was believed the patient had  a probable partial abruption and the decision was made to take her to  the OR for a stat C-section.  The procedure was explained to the patient  and the family consented to proceed.  The patient did undergo a routine  primary low transverse cesarean section without complications.  Please  see the operative note dictation for further details.  Following the C-  section, she was taken to the postpartum floor and given routine  postpartum care, including Percocet for pain.  Patient did well.  She is  breast-feeding and pain was well-controlled at the time of discharge.  She was able to tolerate food and ambulating without difficulty.  Patient does desire a tubal ligation.  However, this will not be done  for approximately six weeks.  In the interim, she would like to  receive  the Depo injection prior to discharge.   At the time of discharge, vitals were stable, including a temperature of  97.4, pulse 86, respirations 20, and blood pressure of 117/81.  Her  fundus was firm and below the umbilicus.  The [facility] nurse will  discontinue the staples at home on postoperative day #5, which will be  December 19.  She will be given routine postpartum medications and  Percocet and should follow up at the [facility] Department  in six weeks for her postpartum check and scheduling of her bilateral  tubal ligation.   CONDITION AT TIME OF DISCHARGE:  Stable.  Discharged to home.   FOLLOWUP:  [facility] Department in six weeks.      Ardeen Garland, MD      Allie Bossier, MD  Electronically Signed    LM/MEDQ  D:  04/19/2007  T:  04/19/2007  Job:  [id number]

## 2011-02-05 LAB — CBC
HCT: 30.5 — ABNORMAL LOW
MCV: 91.9
RBC: 3.32 — ABNORMAL LOW

## 2011-02-08 LAB — RAPID URINE DRUG SCREEN, HOSP PERFORMED
Amphetamines: NOT DETECTED
Barbiturates: NOT DETECTED
Cocaine: NOT DETECTED
Opiates: NOT DETECTED
Tetrahydrocannabinol: NOT DETECTED

## 2011-02-08 LAB — CROSSMATCH

## 2011-02-08 LAB — CBC
HCT: 34.4 — ABNORMAL LOW
Hemoglobin: 12
MCHC: 34.9
MCV: 90.8
Platelets: 219

## 2011-02-08 LAB — RPR: RPR Ser Ql: NONREACTIVE

## 2011-02-08 LAB — ABO/RH: ABO/RH(D): O POS

## 2011-02-11 LAB — COMPREHENSIVE METABOLIC PANEL
ALT: 15
Alkaline Phosphatase: 90
BUN: 11
Calcium: 8.6
Potassium: 4.2
Sodium: 139
Total Bilirubin: 0.5

## 2011-02-11 LAB — URINALYSIS, ROUTINE W REFLEX MICROSCOPIC
Bilirubin Urine: NEGATIVE
Nitrite: NEGATIVE
Protein, ur: NEGATIVE

## 2011-02-11 LAB — CBC
MCHC: 35.3
Platelets: 218
RBC: 3.95
RDW: 12.6

## 2011-02-11 LAB — LIPASE, BLOOD: Lipase: 24

## 2011-02-11 LAB — AMYLASE: Amylase: 130

## 2014-03-06 DIAGNOSIS — E669 Obesity, unspecified: Secondary | ICD-10-CM | POA: Insufficient documentation

## 2014-04-12 DIAGNOSIS — M797 Fibromyalgia: Secondary | ICD-10-CM | POA: Insufficient documentation

## 2015-07-14 DIAGNOSIS — J301 Allergic rhinitis due to pollen: Secondary | ICD-10-CM | POA: Insufficient documentation

## 2015-08-27 ENCOUNTER — Encounter: Payer: Self-pay | Admitting: Obstetrics and Gynecology

## 2015-08-27 ENCOUNTER — Ambulatory Visit (INDEPENDENT_AMBULATORY_CARE_PROVIDER_SITE_OTHER): Payer: Medicaid Other | Admitting: Obstetrics and Gynecology

## 2015-08-27 VITALS — BP 123/74 | HR 71 | Ht 62.75 in | Wt 177.1 lb

## 2015-08-27 DIAGNOSIS — Z308 Encounter for other contraceptive management: Secondary | ICD-10-CM

## 2015-08-27 DIAGNOSIS — Z30433 Encounter for removal and reinsertion of intrauterine contraceptive device: Secondary | ICD-10-CM | POA: Diagnosis not present

## 2015-08-27 NOTE — Patient Instructions (Signed)
1. Return in 4 weeks for IUD string check  Intrauterine Device Insertion, Care After Refer to this sheet in the next few weeks. These instructions provide you with information on caring for yourself after your procedure. Your health care provider may also give you more specific instructions. Your treatment has been planned according to current medical practices, but problems sometimes occur. Call your health care provider if you have any problems or questions after your procedure. WHAT TO EXPECT AFTER THE PROCEDURE Insertion of the IUD may cause some discomfort, such as cramping. The cramping should improve after the IUD is in place. You may have bleeding after the procedure. This is normal. It varies from light spotting for a few days to menstrual-like bleeding. When the IUD is in place, a string will extend past the cervix into the vagina for 1-2 inches. The strings should not bother you or your partner. If they do, talk to your health care provider.  HOME CARE INSTRUCTIONS   Check your intrauterine device (IUD) to make sure it is in place before you resume sexual activity. You should be able to feel the strings. If you cannot feel the strings, something may be wrong. The IUD may have fallen out of the uterus, or the uterus may have been punctured (perforated) during placement. Also, if the strings are getting longer, it may mean that the IUD is being forced out of the uterus. You no longer have full protection from pregnancy if any of these problems occur.  You may resume sexual intercourse if you are not having problems with the IUD. The copper IUD is considered immediately effective, and the hormone IUD works right away if inserted within 7 days of your period starting. You will need to use a backup method of birth control for 7 days if the IUD in inserted at any other time in your cycle.  Continue to check that the IUD is still in place by feeling for the strings after every menstrual  period.  You may need to take pain medicine such as acetaminophen or ibuprofen. Only take medicines as directed by your health care provider. SEEK MEDICAL CARE IF:   You have bleeding that is heavier or lasts longer than a normal menstrual cycle.  You have a fever.  You have increasing cramps or abdominal pain not relieved with medicine.  You have abdominal pain that does not seem to be related to the same area of earlier cramping and pain.  You are lightheaded, unusually weak, or faint.  You have abnormal vaginal discharge or smells.  You have pain during sexual intercourse.  You cannot feel the IUD strings, or the IUD string has gotten longer.  You feel the IUD at the opening of the cervix in the vagina.  You think you are pregnant, or you miss your menstrual period.  The IUD string is hurting your sex partner. MAKE SURE YOU:  Understand these instructions.  Will watch your condition.  Will get help right away if you are not doing well or get worse.   This information is not intended to replace advice given to you by your health care provider. Make sure you discuss any questions you have with your health care provider.   Document Released: 12/16/2010 Document Revised: 02/07/2013 Document Reviewed: 10/08/2012 Elsevier Interactive Patient Education [employer]2016 Elsevier Inc.

## 2015-08-27 NOTE — Progress Notes (Signed)
GYN ENCOUNTER NOTE  Subjective:       [name] is a 45 y.o. G40P1001 female is here for gynecologic evaluation of the following issues:  1. Contraceptive management  45 year old white female para 1001, using Mirena IUD for contraception, now requiring possible reinsertion after 5 years. Patient presents with multiple questions regarding alternative contraceptive methods..    Patient is amenorrheic on the Mirena IUD until this last 6 months when she began having intermittent spotting. She is not experiencing any pelvic pain. Patient is currently undergoing weight loss management with diet, exercise, and phentermine use. She would like to continue her weight loss regimen and goals.  Patient is a nonsmoker. She does not have any history of DVT, PE. History of hypertension. No history of migraines. She does have some   Gynecologic History No LMP recorded. Patient is not currently having periods (Reason: IUD). Contraception: IUD Last Pap: Normal   Obstetric History OB History  Gravida Para Term Preterm AB SAB TAB Ectopic Multiple Living  # Outcome Date GA Lbr Len/2nd Weight Sex Delivery Anes PTL Lv  1 Term 04/16/07 [redacted]w[redacted]d  7 lb 8 oz (3.402 kg) M CS-Unspec  N Y      Past Medical History  Diagnosis Date  . Seasonal allergies   . Hx of essential hypertension   . Irregular menses     Past Surgical History  Procedure Laterality Date  . Laparoscopy      exploratory  . Cesearean sec    . Cesarean section      No current outpatient prescriptions on file prior to visit.   No current facility-administered medications on file prior to visit.    Allergies  Allergen Reactions  . Penicillin G Hives  . Sulfa Antibiotics Hives    Social History   Social History  . Marital Status: Single    Spouse Name: N/A  . Number of Children: N/A  . Years of Education: N/A   Occupational History  . medical transcription    Social History Main Topics  . Smoking  status: Former Games developer  . Smokeless tobacco: Never Used  . Alcohol Use: 0.0 oz/week    0 Standard drinks or equivalent per week  . Drug Use: No  . Sexual Activity: Not Currently   Other Topics Concern  . Not on file   Social History Narrative    Family History  Problem Relation Age of Onset  . Heart failure Maternal Grandmother   . Hemochromatosis Maternal Grandfather     pt is negative  . Depression      both sides    The following portions of the patient's history were reviewed and updated as appropriate: allergies, current medications, past family history, past medical history, past social history, past surgical history and problem list.  Review of Systems: Per history of present illness  Objective:   BP 123/74 mmHg  Pulse 71  Ht 5' 2.75" (1.594 m)  Wt 177 lb 2 oz (80.343 kg)  BMI 31.62 kg/m2 CONSTITUTIONAL: Well-developed, well-nourished female in no acute distress.  HENT:  Normocephalic, atraumatic.  NECK: Normal range of motion, supple, no masses.  Normal thyroid.  SKIN: Skin is warm and dry. No rash noted. Not diaphoretic. No erythema. No pallor. NEUROLGIC: Alert and oriented to person, place, and time. PSYCHIATRIC: Normal mood and affect. Normal behavior. Normal judgment and thought content. CARDIOVASCULAR:Not Examined RESPIRATORY: Not Examined BREASTS: Not Examined ABDOMEN: Soft,  non distended; Non tender.  No Organomegaly.  Midline laparotomy scar healed; Pfannenstiel scar healed PELVIC:  External Genitalia: Normal  BUS: Normal  Vagina: Normal  Cervix: Normal; IUD string 3 cm  Uterus: Normal size, shape,consistency, mobile  Adnexa: Normal  RV: Normal   Bladder: Nontender MUSCULOSKELETAL: Normal range of motion. No tenderness.  No cyanosis, clubbing, or edema.  IUD removal procedure note IUD Insertion Procedure Note  Pre-operative Diagnosis: Desires contraception  Post-operative Diagnosis: Same as above  Indications: contraception  Procedure  Details  Urine pregnancy test was not done.  The risks (including infection, bleeding, pain, and uterine perforation) and benefits of the procedure were explained to the patient and Verbal informed consent was obtained.    Cervix cleansed with Betadine. [place] forceps is used to remove the existing IUD. Uterus sounded to 7 cm. IUD inserted without difficulty. String visible and trimmed. Patient tolerated procedure well.  IUD Information: Mirena, Lot #[id number], Expiration date 9/19.  Condition: Stable  Complications: None  Plan:  The patient was advised to call for any fever or for prolonged or severe pain or bleeding. She was advised to use OTC acetaminophen and OTC ibuprofen as needed for mild to moderate pain.   Attending Physician Documentation: Herold HarmsMartin A Jailyne Chieffo, MD     Assessment:   Contraceptive management (Mirena IUD removal/Mirena IUD insertion)   Plan:   1. Mirena IUD removed (see note above) 2. Mirena IUD insertion (see note above) 3. Return in 4 weeks for IUD string check.Herold HarmsMartin A Eleonore Shippee, MD  Note: This dictation was prepared with Dragon dictation along with smaller phrase technology. Any transcriptional errors that result from this process are unintentional.

## 2015-09-04 ENCOUNTER — Emergency Department: Payer: Medicaid Other

## 2015-09-04 ENCOUNTER — Emergency Department
Admission: EM | Admit: 2015-09-04 | Discharge: 2015-09-04 | Disposition: A | Discharge status: Home / Self Care | Payer: Medicaid Other | Attending: Emergency Medicine | Admitting: Emergency Medicine

## 2015-09-04 DIAGNOSIS — I10 Essential (primary) hypertension: Secondary | ICD-10-CM | POA: Diagnosis not present

## 2015-09-04 DIAGNOSIS — S93601A Unspecified sprain of right foot, initial encounter: Secondary | ICD-10-CM | POA: Diagnosis not present

## 2015-09-04 DIAGNOSIS — Z880 Allergy status to penicillin: Secondary | ICD-10-CM | POA: Diagnosis not present

## 2015-09-04 DIAGNOSIS — Y9389 Activity, other specified: Secondary | ICD-10-CM | POA: Insufficient documentation

## 2015-09-04 DIAGNOSIS — Z79899 Other long term (current) drug therapy: Secondary | ICD-10-CM | POA: Diagnosis not present

## 2015-09-04 DIAGNOSIS — Z87891 Personal history of nicotine dependence: Secondary | ICD-10-CM | POA: Insufficient documentation

## 2015-09-04 DIAGNOSIS — Y92007 Garden or yard of unspecified non-institutional (private) residence as the place of occurrence of the external cause: Secondary | ICD-10-CM | POA: Diagnosis not present

## 2015-09-04 DIAGNOSIS — X501XXA Overexertion from prolonged static or awkward postures, initial encounter: Secondary | ICD-10-CM | POA: Diagnosis not present

## 2015-09-04 DIAGNOSIS — Y990 Civilian activity done for income or pay: Secondary | ICD-10-CM | POA: Insufficient documentation

## 2015-09-04 DIAGNOSIS — S99921A Unspecified injury of right foot, initial encounter: Secondary | ICD-10-CM | POA: Diagnosis present

## 2015-09-04 DIAGNOSIS — M722 Plantar fascial fibromatosis: Secondary | ICD-10-CM

## 2015-09-04 IMAGING — CR DG ANKLE COMPLETE 3+V*R*
1 series · 3 of 3 positions shown · non-contrast
Comparison: None.

CLINICAL DATA: Pain after feeling a pop in her right ankle

EXAM:
RIGHT ANKLE - COMPLETE 3+ VIEW

[Series 1: x ankle ap right · 0.14mm/px · 3 of 3 slices shown]
[im 1/3]
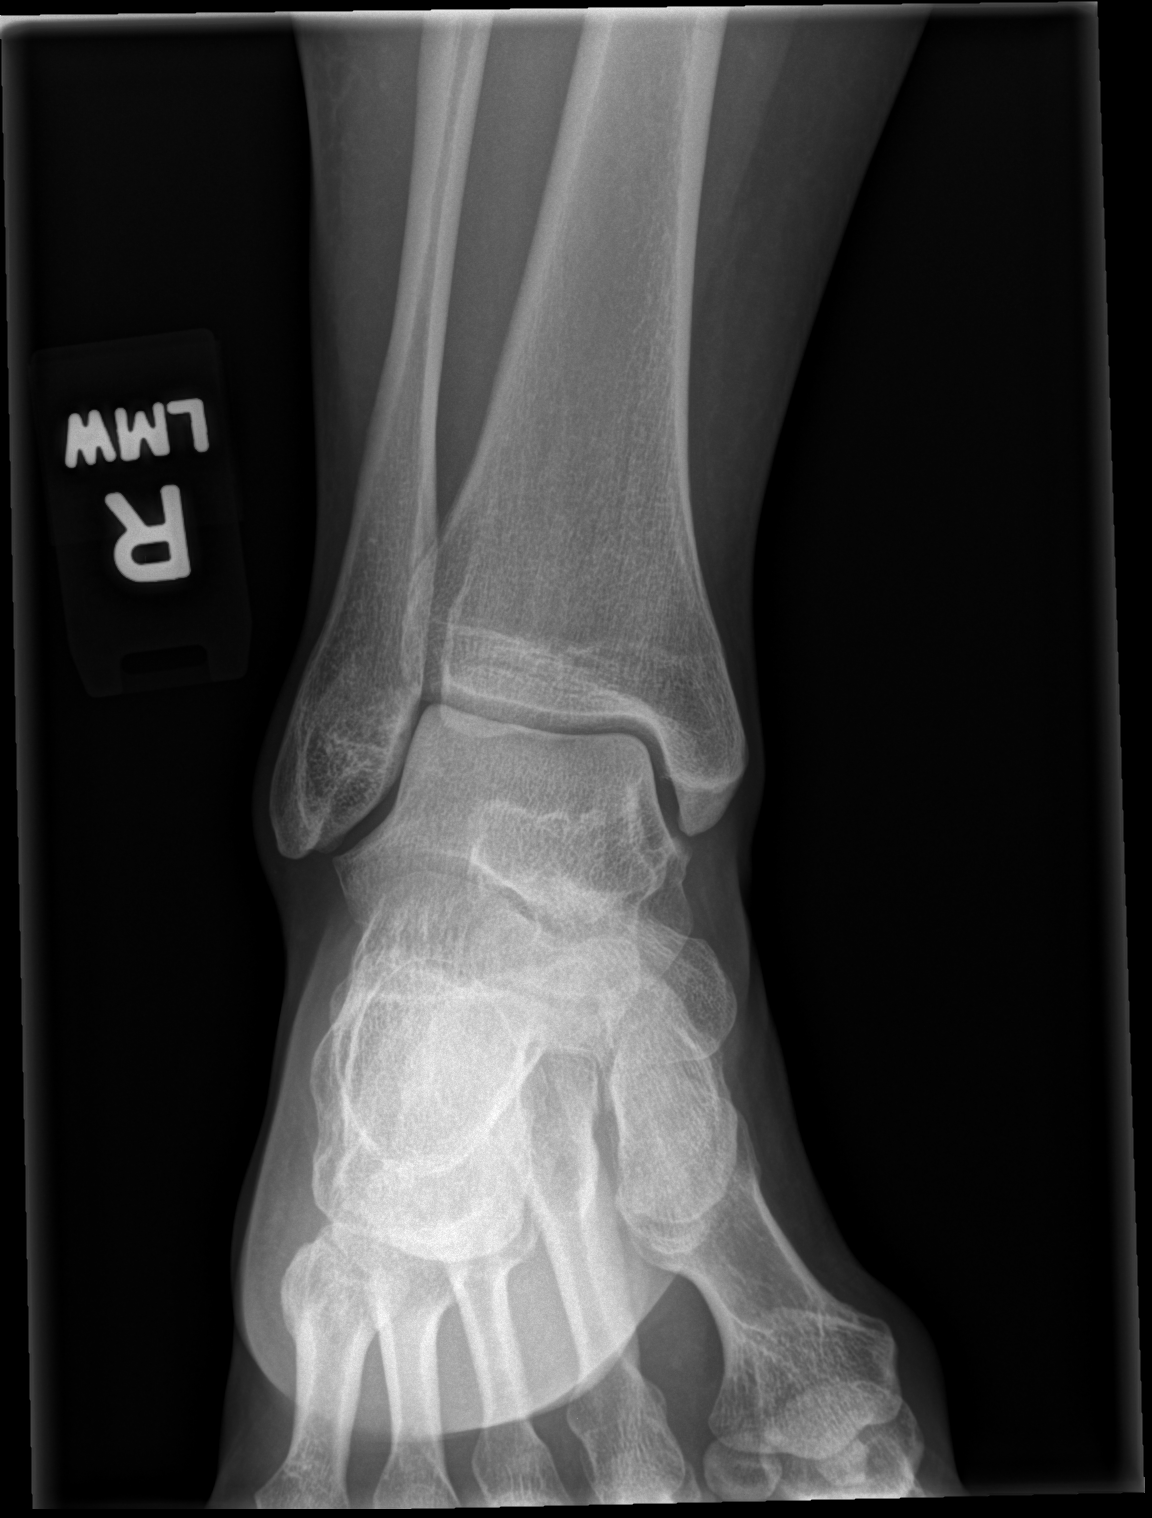
[im 2/3]
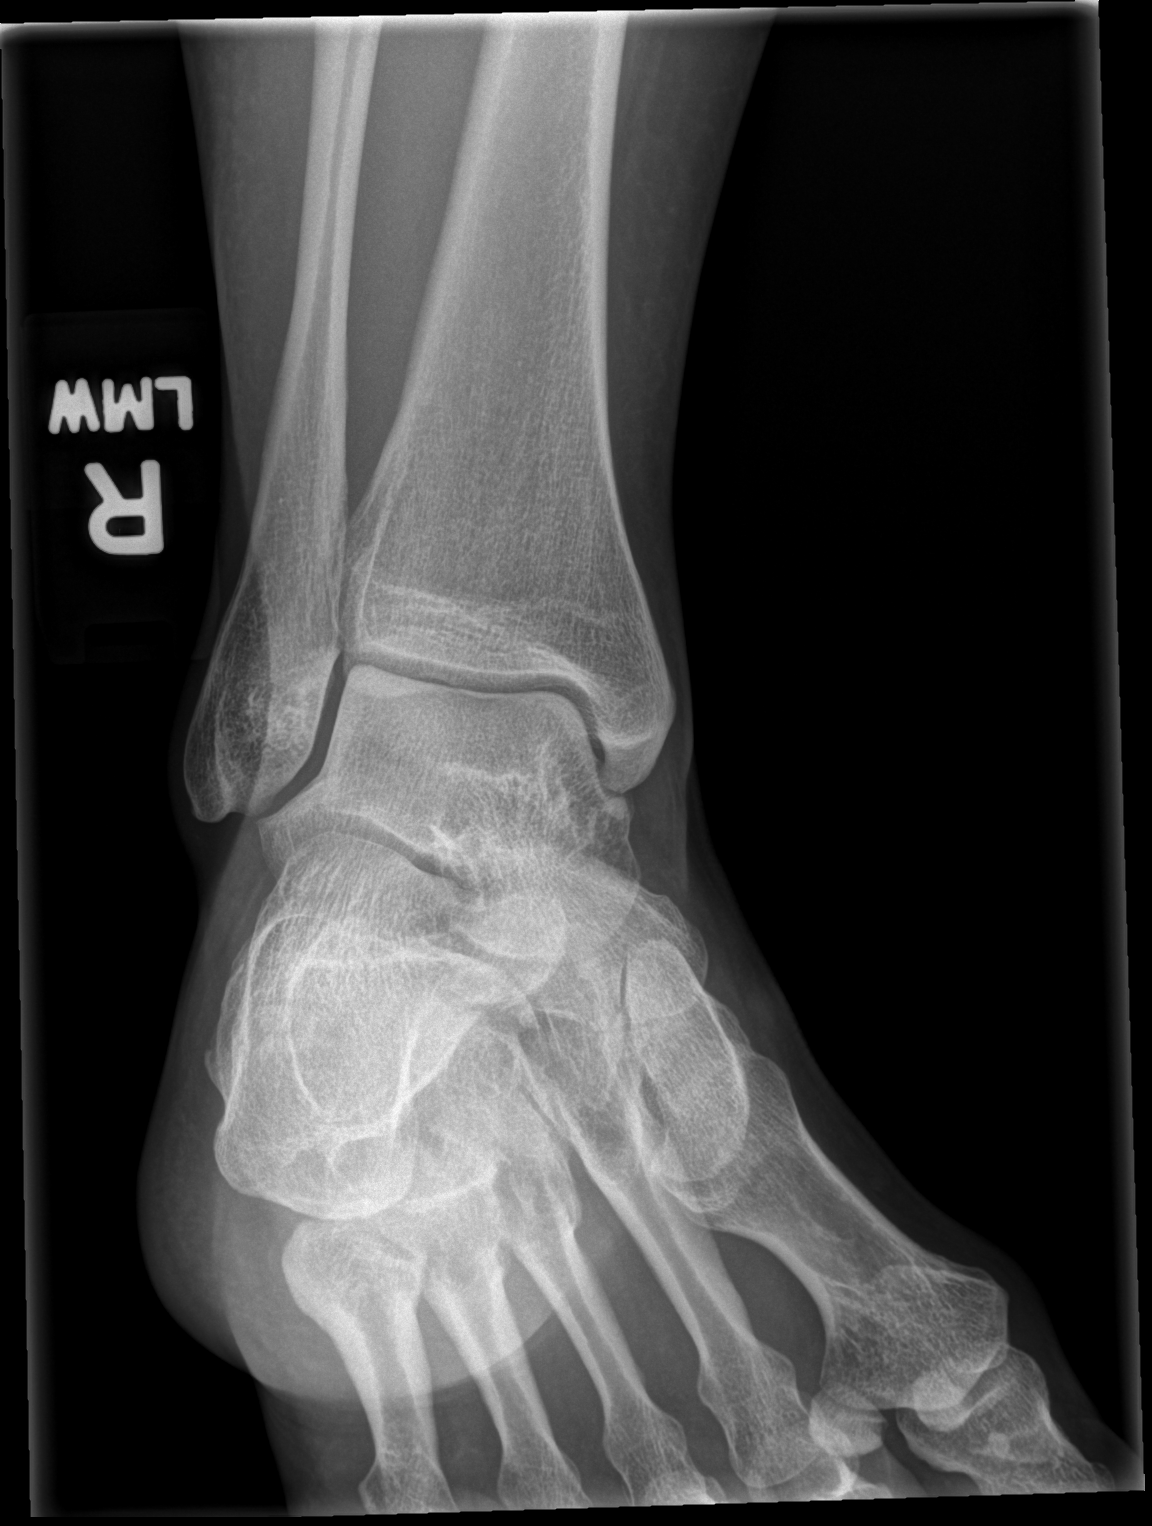
[im 3/3]
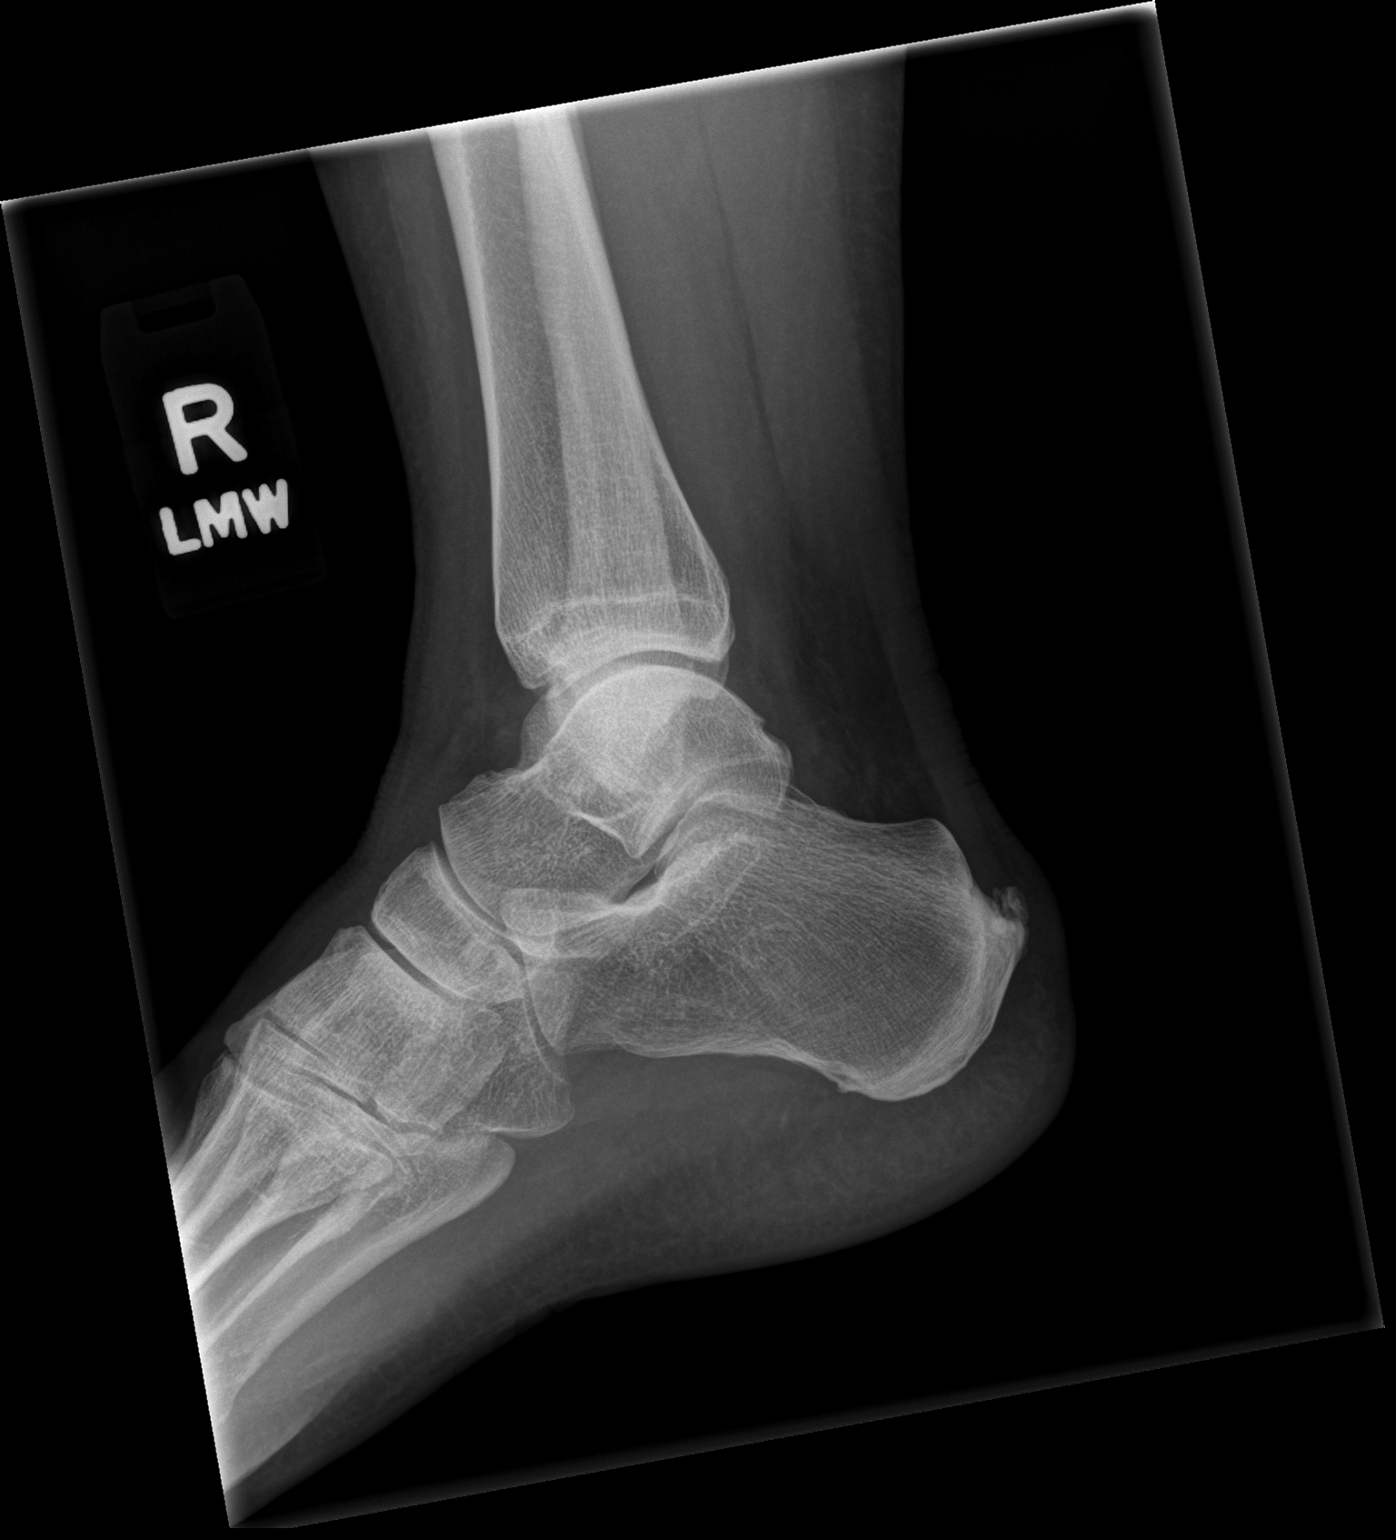

[3 of 3 positions shown; findings below may reference images not displayed]

FINDINGS: There is no evidence of fracture, dislocation, or joint effusion.
There is no evidence of arthropathy or other focal bone abnormality.
Soft tissues are unremarkable.
IMPRESSION: Negative.

## 2015-09-04 NOTE — ED Provider Notes (Signed)
[facility] Emergency Department Provider Note ____________________________________________  Time seen: 2014  I have reviewed the triage vital signs and the nursing notes.  HISTORY  Chief Complaint  Ankle Pain  HPI [name] is a 45 y.o. female visits to the ED for evaluation of pain to the lateral and plantar surface of her right foot. 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 At some point she rolled her foot and a divot in the yard. She describes pain initially to the lateral aspect of the foot, and has had difficulty in weightbearing and pushing off. She now notes pain to the plantar surface and to the lateral surface of the foot. Pain is primarily around the base of the fifth toe up towards the ball of the pinky toe. She denies any ankle pain or disability. She reports some mild achiness in the calf secondary to walking on her heel. She denies any other injury at this time, and rates her pain an 8/10 in triage.  Past Medical History  Diagnosis Date  . Seasonal allergies   . Hx of essential hypertension   . Irregular menses     Patient Active Problem List   Diagnosis Date Noted  . Hay fever 07/14/2015  . Fibromyalgia 04/12/2014  . Adiposity 03/06/2014    Past Surgical History  Procedure Laterality Date  . Laparoscopy      exploratory  . Cesearean sec    . Cesarean section      Current Outpatient Rx  Name  Route  Sig  Dispense  Refill  . Cetirizine HCl 10 MG CAPS   Oral   Take by mouth.         . fluticasone (FLONASE) 50 MCG/ACT nasal spray   Nasal   Place into the nose.         . levonorgestrel (MIRENA) 20 MCG/24HR IUD   Intrauterine   by Intrauterine route.         . Olopatadine HCl 0.2 % SOLN   Ophthalmic   Apply to eye.         . phentermine 37.5 MG capsule   Oral   Take by mouth.           Allergies Penicillin g and  Sulfa antibiotics  Family History  Problem Relation Age of Onset  . Heart failure Maternal Grandmother   . Hemochromatosis Maternal Grandfather     pt is negative  . Depression      both sides    Social History Social History  Substance Use Topics  . Smoking status: Former Games developermoker  . Smokeless tobacco: Never Used  . Alcohol Use: 0.0 oz/week    0 Standard drinks or equivalent per week    Review of Systems  Constitutional: Negative for fever. Musculoskeletal: Negative for back pain. Right foot pain as above. Skin: Negative for rash. Neurological: Negative for headaches, focal weakness or numbness. ____________________________________________  PHYSICAL EXAM:  VITAL SIGNS: ED Triage Vitals  Enc Vitals Group     BP 09/04/15 1954 133/62 mmHg     Pulse Rate 09/04/15 1954 85     Resp 09/04/15 1954 20     Temp 09/04/15 1954 98.4 F (36.9 C)     Temp Source 09/04/15 1954 Oral     SpO2 09/04/15 1954 97 %     Weight 09/04/15 1954 174 lb (78.926 kg)     Height 09/04/15 1954   (1.575 m)     Head Cir --      Peak Flow --      Pain Score 09/04/15 1954 8     Pain Loc --      Pain Edu? --      Excl. in GC? --     Constitutional: Alert and oriented. Well appearing and in no distress. Head: Normocephalic and atraumatic. Cardiovascular: Normal distal pulses and cap refill.   Respiratory: Normal respiratory effort.  Musculoskeletal: Right foot without obvious deformity, edema, ecchymosis, or dislocation. Normal ankle exam. Minimally tender to palp to the lateral foot. Minimal tenderness to palp over the 5th MTP on the plantar surface. Nontender with normal range of motion in all extremities.  Neurologic:  Antalgic gait without ataxia. Normal speech and language. No gross focal neurologic deficits are appreciated. Skin:  Skin is warm, dry and intact. No rash noted. ____________________________________________   RADIOLOGY  Right Foot IMPRESSION: Negative.  I, Neveyah Garzon,  Charlesetta Ivory, personally viewed and evaluated these images (plain radiographs) as part of my medical decision making, as well as reviewing the written report by the radiologist. ____________________________________________  PROCEDURES  Post-op Shoe Crutches ____________________________________________  INITIAL IMPRESSION / ASSESSMENT AND PLAN / ED COURSE  Patient with exam findings consistent with a foot sprain without evidence of fracture dislocation. She may also be experiencing some mild plantar fasciitis. She was fitted with a postop shoe for comfort and encouraged to follow-up with podiatry for ongoing symptom management. She'll also be provided with crutches to use for weightbearing as tolerated. She should take over-the-counter ibuprofen as needed for pain relief. She is to rest with the elevated and apply ice as necessary. ____________________________________________  FINAL CLINICAL IMPRESSION(S) / ED DIAGNOSES  Final diagnoses:  Foot sprain, right, initial encounter  Plantar fasciitis, right      Lissa Hoard, PA-C 09/04/15 2120  Arnaldo Natal, MD 09/15/15 [phone]

## 2015-09-04 NOTE — ED Notes (Addendum)
Pt to triage via w/c with no distress noted; reports outside picking up tennis balls and felt "pop" to right ankle; ice pack to pt

## 2015-09-04 NOTE — Discharge Instructions (Signed)
Foot Sprain A foot sprain is an injury to one of the strong bands of tissue (ligaments) that connect and support the many bones in your feet. The ligament can be stretched too much or it can tear. A tear can be either partial or complete. The severity of the sprain depends on how much of the ligament was damaged or torn. CAUSES A foot sprain is usually caused by suddenly twisting or pivoting your foot. RISK FACTORS This injury is more likely to occur in people who:  Play a sport, such as basketball or football.  Exercise or play a sport without warming up.  Start a new workout or sport.  Suddenly increase how long or hard they exercise or play a sport. SYMPTOMS Symptoms of this condition start soon after an injury and include:  Pain, especially in the arch of the foot.  Bruising.  Swelling.  Inability to walk or use the foot to support body weight. DIAGNOSIS This condition is diagnosed with a medical history and physical exam. You may also have imaging tests, such as:  X-rays to make sure there are no broken bones (fractures).  MRI to see if the ligament has torn. TREATMENT Treatment varies depending on the severity of your sprain. Mild sprains can be treated with rest, ice, compression, and elevation (RICE). If your ligament is overstretched or partially torn, treatment usually involves keeping your foot in a fixed position (immobilization) for a period of time. To help you do this, your health care provider will apply a bandage, splint, or walking boot to keep your foot from moving until it heals. You may also be advised to use crutches or a scooter for a few weeks to avoid bearing weight on your foot while it is healing. If your ligament is fully torn, you may need surgery to reconnect the ligament to the bone. After surgery, a cast or splint will be applied and will need to stay on your foot while it heals. Your health care provider may also suggest exercises or physical therapy  to strengthen your foot. HOME CARE INSTRUCTIONS If You Have a Bandage, Splint, or Walking Boot:  Wear it as directed by your health care provider. Remove it only as directed by your health care provider.  Loosen the bandage, splint, or walking boot if your toes become numb and tingle, or if they turn cold and blue. Bathing  If your health care provider approves bathing and showering, cover the bandage or splint with a watertight plastic bag to protect it from water. Do not let the bandage or splint get wet. Managing Pain, Stiffness, and Swelling   If directed, apply ice to the injured area:  Put ice in a plastic bag.  Place a towel between your skin and the bag.  Leave the ice on for 20 minutes, 2-3 times per day.  Move your toes often to avoid stiffness and to lessen swelling.  Raise (elevate) the injured area above the level of your heart while you are sitting or lying down. Driving  Do not drive or operate heavy machinery while taking pain medicine.  Do not drive while wearing a bandage, splint, or walking boot on a foot that you use for driving. Activity  Rest as directed by your health care provider.  Do not use the injured foot to support your body weight until your health care provider says that you can. Use crutches or other supportive devices as directed by your health care provider.  Ask your health care  provider what activities are safe for you. Gradually increase how much and how far you walk until your health care provider says it is safe to return to full activity.  Do any exercise or physical therapy as directed by your health care provider. General Instructions  If a splint was applied, do not put pressure on any part of it until it is fully hardened. This may take several hours.  Take medicines only as directed by your health care provider. These include over-the-counter medicines and prescription medicines.  Keep all follow-up visits as directed by your  health care provider. This is important.  When you can walk without pain, wear supportive shoes that have stiff soles. Do not wear flip-flops, and do not walk barefoot. SEEK MEDICAL CARE IF:  Your pain is not controlled with medicine.  Your bruising or swelling gets worse or does not get better with treatment.  Your splint or walking boot is damaged. SEEK IMMEDIATE MEDICAL CARE IF:  Your foot is numb or blue.  Your foot feels colder than normal.   This information is not intended to replace advice given to you by your health care provider. Make sure you discuss any questions you have with your health care provider.   Document Released: 10/09/2001 Document Revised: 09/03/2014 Document Reviewed: 02/20/2014 Elsevier Interactive Patient Education 2016 Elsevier Inc.  Plantar Fasciitis Plantar fasciitis is a painful foot condition that affects the heel. It occurs when the band of tissue that connects the toes to the heel bone (plantar fascia) becomes irritated. This can happen after exercising too much or doing other repetitive activities (overuse injury). The pain from plantar fasciitis can range from mild irritation to severe pain that makes it difficult for you to walk or move. The pain is usually worse in the morning or after you have been sitting or lying down for a while. CAUSES This condition may be caused by:  Standing for long periods of time.  Wearing shoes that do not fit.  Doing high-impact activities, including running, aerobics, and ballet.  Being overweight.  Having an abnormal way of walking (gait).  Having tight calf muscles.  Having high arches in your feet.  Starting a new athletic activity. SYMPTOMS The main symptom of this condition is heel pain. Other symptoms include:  Pain that gets worse after activity or exercise.  Pain that is worse in the morning or after resting.  Pain that goes away after you walk for a few minutes. DIAGNOSIS This condition  may be diagnosed based on your signs and symptoms. Your health care provider will also do a physical exam to check for:  A tender area on the bottom of your foot.  A high arch in your foot.  Pain when you move your foot.  Difficulty moving your foot. You may also need to have imaging studies to confirm the diagnosis. These can include:  X-rays.  Ultrasound.  MRI. TREATMENT  Treatment for plantar fasciitis depends on the severity of the condition. Your treatment may include:  Rest, ice, and over-the-counter pain medicines to manage your pain.  Exercises to stretch your calves and your plantar fascia.  A splint that holds your foot in a stretched, upward position while you sleep (night splint).  Physical therapy to relieve symptoms and prevent problems in the future.  Cortisone injections to relieve severe pain.  Extracorporeal shock wave therapy (ESWT) to stimulate damaged plantar fascia with electrical impulses. It is often used as a last resort before surgery.  Surgery,  if other treatments have not worked after 12 months. HOME CARE INSTRUCTIONS  Take medicines only as directed by your health care provider.  Avoid activities that cause pain.  Roll the bottom of your foot over a bag of ice or a bottle of cold water. Do this for 20 minutes, 3-4 times a day.  Perform simple stretches as directed by your health care provider.  Try wearing athletic shoes with air-sole or gel-sole cushions or soft shoe inserts.  Wear a night splint while sleeping, if directed by your health care provider.  Keep all follow-up appointments with your health care provider. PREVENTION   Do not perform exercises or activities that cause heel pain.  Consider finding low-impact activities if you continue to have problems.  Lose weight if you need to. The best way to prevent plantar fasciitis is to avoid the activities that aggravate your plantar fascia. SEEK MEDICAL CARE IF:  Your symptoms  do not go away after treatment with home care measures.  Your pain gets worse.  Your pain affects your ability to move or do your daily activities.   This information is not intended to replace advice given to you by your health care provider. Make sure you discuss any questions you have with your health care provider.   Document Released: 01/12/2001 Document Revised: 01/08/2015 Document Reviewed: 02/27/2014 Elsevier Interactive Patient Education 2016 Elsevier Inc.   Wear the post-op shoe as needed for comfort. Use the crutches as needed. Rest with the foot elevated and apply ice as needed. Take OTC ibuprofen and follow-up with podiatrist as needed for continued symptoms.

## 2015-09-24 ENCOUNTER — Encounter: Payer: Self-pay | Admitting: Obstetrics and Gynecology

## 2015-09-24 ENCOUNTER — Encounter: Payer: Self-pay | Admitting: Women's Health

## 2015-12-01 IMAGING — CR DG FOOT COMPLETE 3+V*R*
1 series · 3 of 3 positions shown · non-contrast
Comparison: None.

CLINICAL DATA: Pain after feeling a pop in her right ankle

EXAM:
RIGHT FOOT COMPLETE - 3+ VIEW

[Series 4: x foot lat right · 0.14mm/px · 3 of 3 slices shown]
[im 1/3]
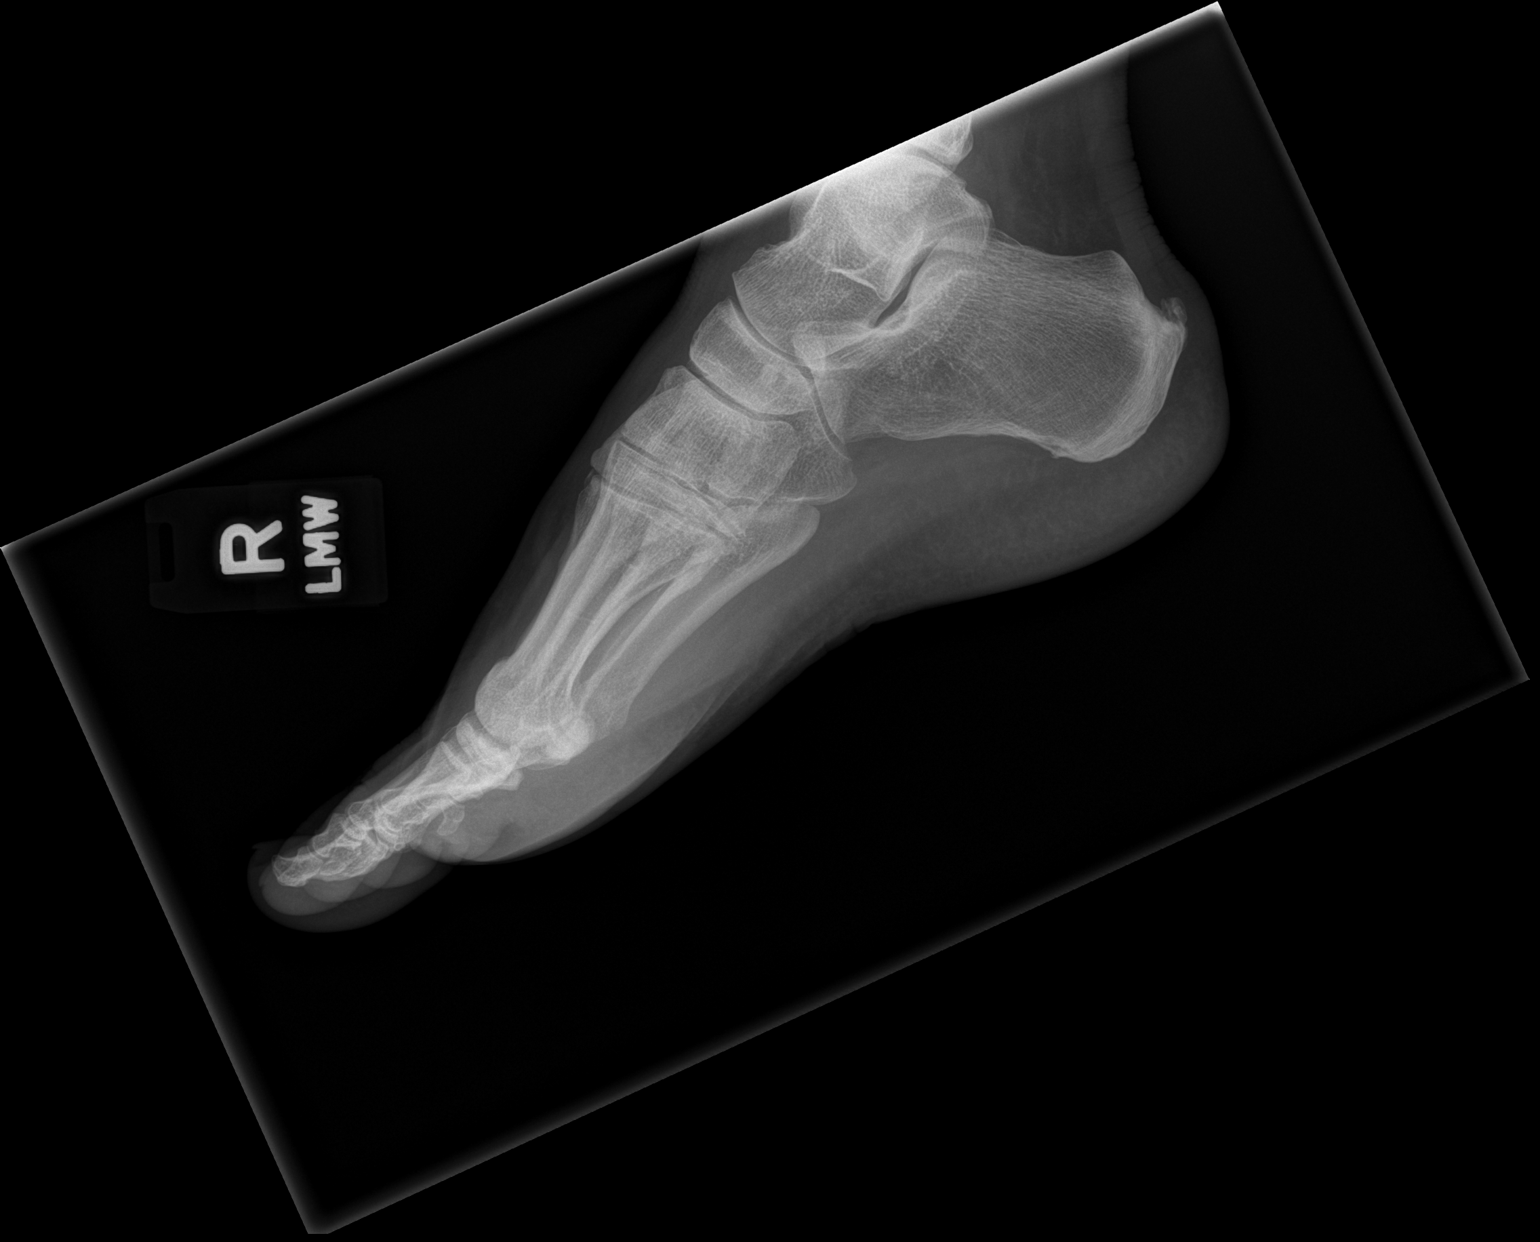
[im 2/3]
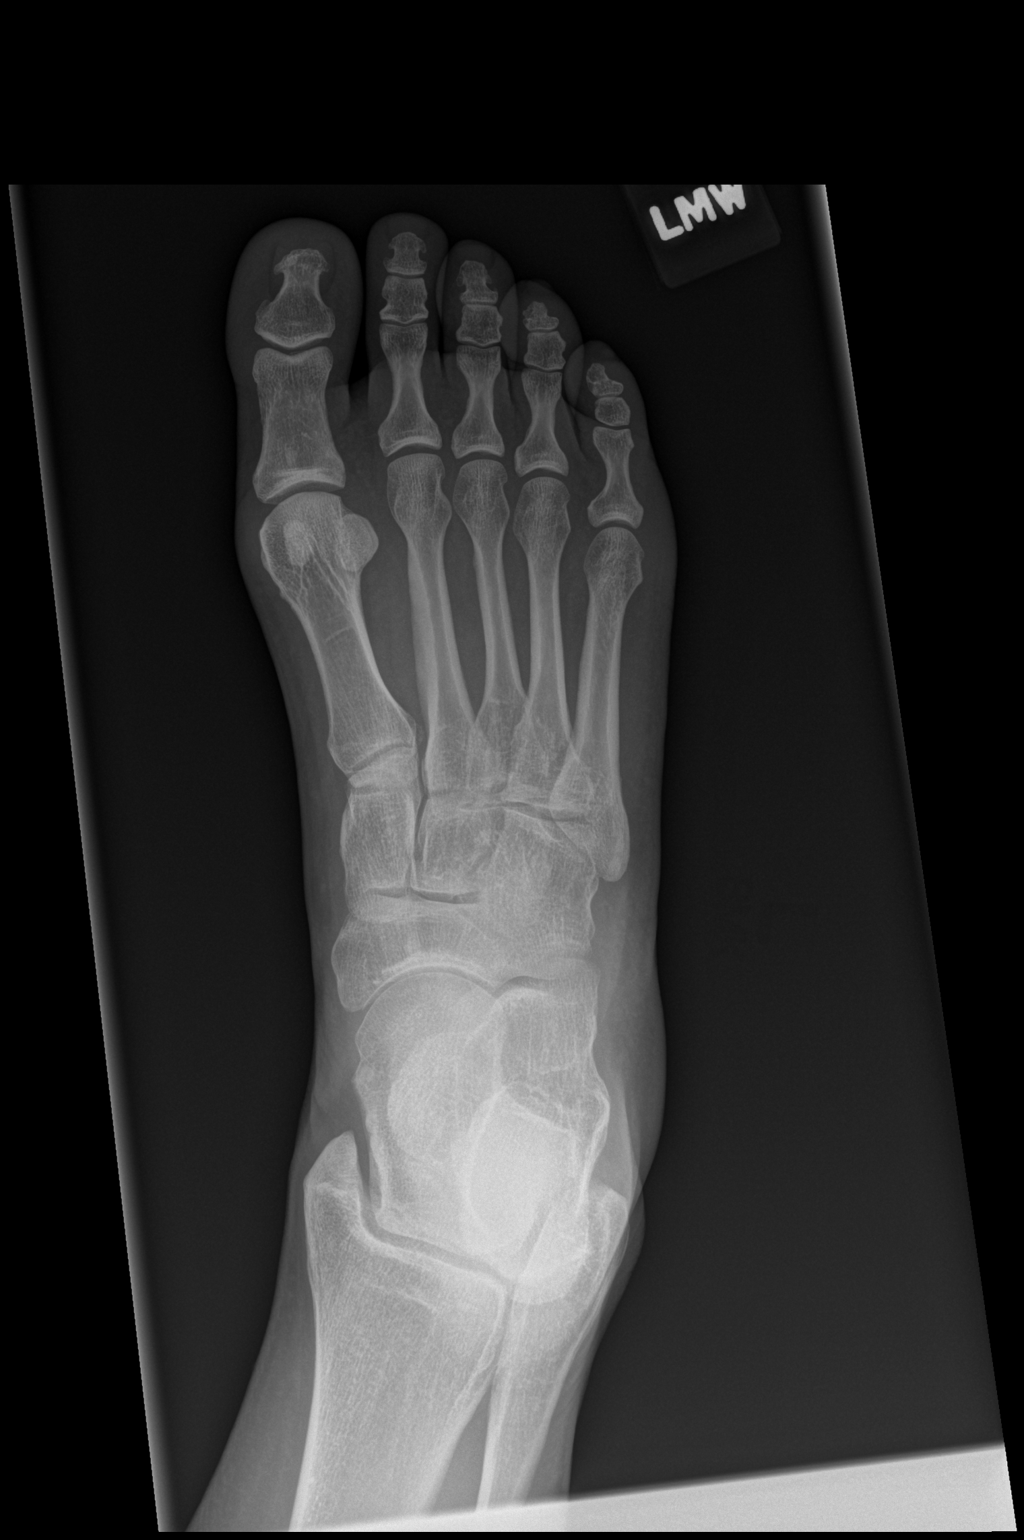
[im 3/3]
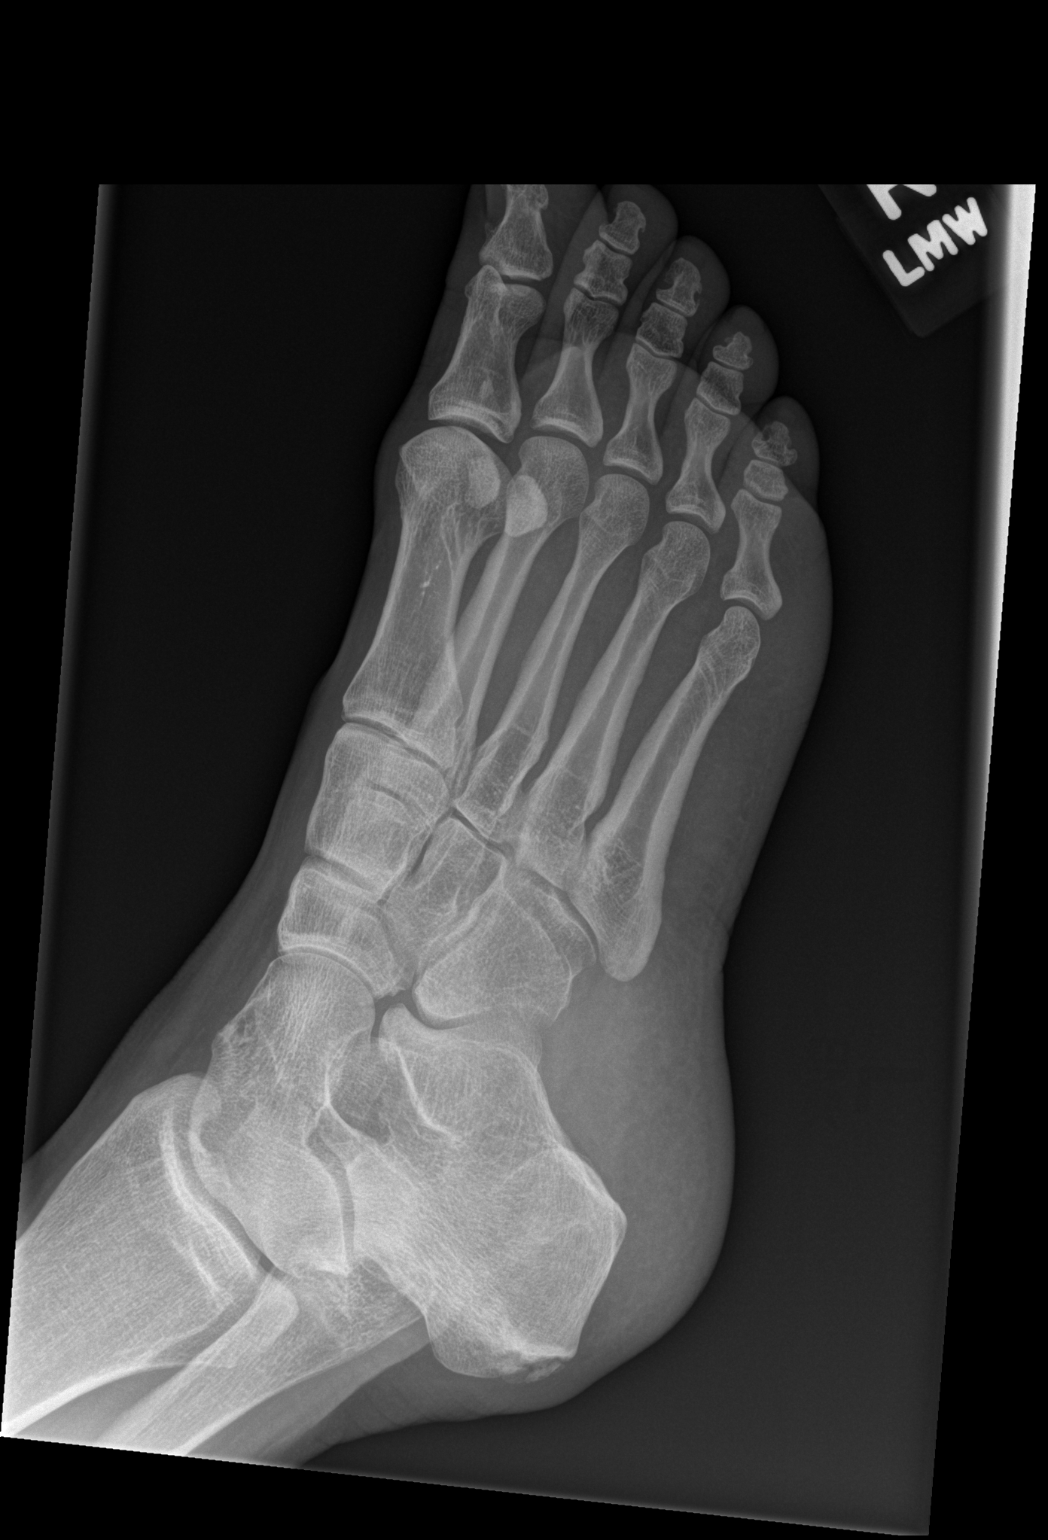

[3 of 3 positions shown; findings below may reference images not displayed]

FINDINGS: There is no evidence of fracture or dislocation. There is slight
degenerative narrowing of the talonavicular joint space. Good
preservation of the other articulations of the foot. No acute bone
abnormality. Soft tissues are unremarkable.
IMPRESSION: Negative.

## 2016-12-03 ENCOUNTER — Ambulatory Visit (INDEPENDENT_AMBULATORY_CARE_PROVIDER_SITE_OTHER): Payer: Medicaid Other | Admitting: Obstetrics and Gynecology

## 2016-12-03 ENCOUNTER — Encounter: Payer: Self-pay | Admitting: Obstetrics and Gynecology

## 2016-12-03 ENCOUNTER — Other Ambulatory Visit: Payer: Self-pay | Admitting: Obstetrics and Gynecology

## 2016-12-03 VITALS — BP 127/93 | HR 86 | Ht 62.5 in | Wt 173.3 lb

## 2016-12-03 DIAGNOSIS — E669 Obesity, unspecified: Secondary | ICD-10-CM | POA: Diagnosis not present

## 2016-12-03 DIAGNOSIS — R928 Other abnormal and inconclusive findings on diagnostic imaging of breast: Secondary | ICD-10-CM

## 2016-12-03 DIAGNOSIS — Z0000 Encounter for general adult medical examination without abnormal findings: Secondary | ICD-10-CM | POA: Diagnosis not present

## 2016-12-03 DIAGNOSIS — Z30431 Encounter for routine checking of intrauterine contraceptive device: Secondary | ICD-10-CM | POA: Diagnosis not present

## 2016-12-03 DIAGNOSIS — Z01419 Encounter for gynecological examination (general) (routine) without abnormal findings: Secondary | ICD-10-CM

## 2016-12-03 LAB — HM PAP SMEAR: HM Pap smear: NEGATIVE

## 2016-12-03 MED ORDER — CYANOCOBALAMIN 1000 MCG/ML IJ SOLN: 1000.0000 ug | INTRAMUSCULAR | 1 refills | Status: DC

## 2016-12-03 MED ORDER — PHENTERMINE HCL 37.5 MG PO TABS: 37.5000 mg | ORAL_TABLET | Freq: Every day | ORAL | 2 refills | Status: DC

## 2016-12-03 NOTE — Patient Instructions (Signed)
Preventive Care 18-39 Years, Female Preventive care refers to lifestyle choices and visits with your health care provider that can promote health and wellness. What does preventive care include?  A yearly physical exam. This is also called an annual well check.  Dental exams once or twice a year.  Routine eye exams. Ask your health care provider how often you should have your eyes checked.  Personal lifestyle choices, including: ? Daily care of your teeth and gums. ? Regular physical activity. ? Eating a healthy diet. ? Avoiding tobacco and drug use. ? Limiting alcohol use. ? Practicing safe sex. ? Taking vitamin and mineral supplements as recommended by your health care provider. What happens during an annual well check? The services and screenings done by your health care provider during your annual well check will depend on your age, overall health, lifestyle risk factors, and family history of disease. Counseling Your health care provider may ask you questions about your:  Alcohol use.  Tobacco use.  Drug use.  Emotional well-being.  Home and relationship well-being.  Sexual activity.  Eating habits.  Work and work Statistician.  Method of birth control.  Menstrual cycle.  Pregnancy history.  Screening You may have the following tests or measurements:  Height, weight, and BMI.  Diabetes screening. This is done by checking your blood sugar (glucose) after you have not eaten for a while (fasting).  Blood pressure.  Lipid and cholesterol levels. These may be checked every 5 years starting at age 38.  Skin check.  Hepatitis C blood test.  Hepatitis B blood test.  Sexually transmitted disease (STD) testing.  BRCA-related cancer screening. This may be done if you have a family history of breast, ovarian, tubal, or peritoneal cancers.  Pelvic exam and Pap test. This may be done every 3 years starting at age 38. Starting at age 30, this may be done  every 5 years if you have a Pap test in combination with an HPV test.  Discuss your test results, treatment options, and if necessary, the need for more tests with your health care provider. Vaccines Your health care provider may recommend certain vaccines, such as:  Influenza vaccine. This is recommended every year.  Tetanus, diphtheria, and acellular pertussis (Tdap, Td) vaccine. You may need a Td booster every 10 years.  Varicella vaccine. You may need this if you have not been vaccinated.  HPV vaccine. If you are 39 or younger, you may need three doses over 6 months.  Measles, mumps, and rubella (MMR) vaccine. You may need at least one dose of MMR. You may also need a second dose.  Pneumococcal 13-valent conjugate (PCV13) vaccine. You may need this if you have certain conditions and were not previously vaccinated.  Pneumococcal polysaccharide (PPSV23) vaccine. You may need one or two doses if you smoke cigarettes or if you have certain conditions.  Meningococcal vaccine. One dose is recommended if you are age 68-21 years and a first-year college student living in a residence hall, or if you have one of several medical conditions. You may also need additional booster doses.  Hepatitis A vaccine. You may need this if you have certain conditions or if you travel or work in places where you may be exposed to hepatitis A.  Hepatitis B vaccine. You may need this if you have certain conditions or if you travel or work in places where you may be exposed to hepatitis B.  Haemophilus influenzae type b (Hib) vaccine. You may need this  if you have certain risk factors.  Talk to your health care provider about which screenings and vaccines you need and how often you need them. This information is not intended to replace advice given to you by your health care provider. Make sure you discuss any questions you have with your health care provider. Document Released: 06/15/2001 Document Revised:  01/07/2016 Document Reviewed: 02/18/2015 Elsevier Interactive Patient Education  2017 Elsevier Inc.  

## 2016-12-03 NOTE — Progress Notes (Signed)
Subjective:   [name] is a 46 y.o. 121P1001 Caucasian female here for a routine well-woman exam.  No LMP recorded. Patient is not currently having periods (Reason: IUD).    Current complaints: desires restart weight loss medications. Is exercising regularly with 10 year autistic son.  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  Past Medical History Past Medical History:  Diagnosis Date  . Hx of essential hypertension   . Irregular menses   . Seasonal allergies     Past Surgical History Past Surgical History:  Procedure Laterality Date  . CESAREAN SECTION    . cesearean sec    . LAPAROSCOPY     exploratory    Gynecologic History G1P1001  No LMP recorded. Patient is not currently having periods (Reason: IUD). Contraception: abstinence and IUD Last Pap: ?Marland Kitchen. Results were: normal, with abnormal in past(follow up normal) Last mammogram: 2017. Results were: abnormal with 6 month follow ups,   Obstetric History OB History  Gravida Para Term Preterm AB Living  1 1 1     1   SAB TAB Ectopic Multiple Live Births          1    # Outcome Date GA Lbr Len/2nd Weight Sex Delivery Anes PTL Lv  1 Term 04/16/07 [id number]  7 lb 8 oz (3.402 kg) M CS-Unspec  N LIV      Current Medications Current Outpatient Prescriptions on File Prior to Visit  Medication Sig Dispense Refill  . levonorgestrel (MIRENA) 20 MCG/24HR IUD by Intrauterine route.    . Olopatadine HCl 0.2 % SOLN Apply to eye.    . Cetirizine HCl 10 MG CAPS Take by mouth.    . fluticasone (FLONASE) 50  MCG/ACT nasal spray Place into the nose.    . phentermine 37.5 MG capsule Take by mouth.     No current facility-administered medications on file prior to visit.     Review of Systems Patient denies any headaches, blurred vision, shortness of breath, chest pain, abdominal pain, problems with bowel movements, urination, or intercourse.  Objective:  BP (!) 127/93   Pulse 86   Ht 5' 2.5" (1.588 m)   Wt 173 lb 4.8 oz (78.6 kg)   BMI 31.19 kg/m  Physical Exam  General:  Well developed, well nourished, no acute distress. She is alert and oriented x3. Skin:  Warm and dry Neck:  Midline trachea, no thyromegaly or nodules Cardiovascular: Regular rate and rhythm, no murmur heard Lungs:  Effort normal, all lung fields clear to auscultation bilaterally Breasts:  No dominant palpable mass, retraction, or nipple discharge Abdomen:  Soft, non tender, no hepatosplenomegaly or masses Pelvic:  External genitalia is normal in appearance.  The vagina is normal in appearance. The cervix is bulbous, no CMT.  Thin prep pap is done with HR HPV cotesting. Uterus is felt to be normal size, shape, and contour.  No adnexal masses or tenderness noted. IUD string not seen. Extremities:  No swelling or varicosities noted Psych:  She has a normal mood and affect  Assessment:   Healthy well-woman exam IUD check obseity  Plan:  Labs  obtained-will follow up accordingly Will schedule pelvic u/s with next visit. To restart adipex and B12, return in 1 month for wt/BP/B12. First B12 shot given today. F/U 1 year for AE , or sooner if needed Mammogram ordered.  Neizan Debruhl Suzan NailerN Nikkia Devoss, CNM

## 2016-12-04 LAB — COMPREHENSIVE METABOLIC PANEL
A/G RATIO: 2 (ref 1.2–2.2)
ALT: 13 IU/L (ref 0–32)
AST: 13 IU/L (ref 0–40)
Albumin: 4.7 g/dL (ref 3.5–5.5)
Alkaline Phosphatase: 61 IU/L (ref 39–117)
BUN / CREAT RATIO: 19 (ref 9–23)
BUN: 13 mg/dL (ref 6–24)
Bilirubin Total: 0.8 mg/dL (ref 0.0–1.2)
CALCIUM: 9.9 mg/dL (ref 8.7–10.2)
CHLORIDE: 101 mmol/L (ref 96–106)
CO2: 24 mmol/L (ref 20–29)
Creatinine, Ser: 0.67 mg/dL (ref 0.57–1.00)
GFR, EST AFRICAN AMERICAN: 123 mL/min/{1.73_m2} (ref >59)
GFR, EST NON AFRICAN AMERICAN: 106 mL/min/{1.73_m2} (ref >59)
GLOBULIN, TOTAL: 2.3 g/dL (ref 1.5–4.5)
Glucose: 101 mg/dL — ABNORMAL HIGH (ref 65–99)
POTASSIUM: 4.1 mmol/L (ref 3.5–5.2)
SODIUM: 139 mmol/L (ref 134–144)
TOTAL PROTEIN: 7 g/dL (ref 6.0–8.5)

## 2016-12-04 LAB — THYROID PANEL WITH TSH
FREE THYROXINE INDEX: 1.9 (ref 1.2–4.9)
T3 UPTAKE RATIO: 28 % (ref 24–39)
T4, Total: 6.7 ug/dL (ref 4.5–12.0)
TSH: 1.39 u[IU]/mL (ref 0.450–4.500)

## 2016-12-04 LAB — HEMOGLOBIN A1C
ESTIMATED AVERAGE GLUCOSE: 91 mg/dL
Hgb A1c MFr Bld: 4.8 % (ref 4.8–5.6)

## 2016-12-04 LAB — VITAMIN D 25 HYDROXY (VIT D DEFICIENCY, FRACTURES): Vit D, 25-Hydroxy: 32.5 ng/mL (ref 30.0–100.0)

## 2016-12-06 LAB — CYTOLOGY - PAP

## 2016-12-08 ENCOUNTER — Encounter: Payer: Self-pay | Admitting: *Deleted

## 2016-12-27 ENCOUNTER — Encounter: Payer: Self-pay | Admitting: Obstetrics and Gynecology

## 2016-12-27 ENCOUNTER — Ambulatory Visit (INDEPENDENT_AMBULATORY_CARE_PROVIDER_SITE_OTHER): Payer: Medicaid Other | Admitting: Obstetrics and Gynecology

## 2016-12-27 ENCOUNTER — Ambulatory Visit (INDEPENDENT_AMBULATORY_CARE_PROVIDER_SITE_OTHER): Payer: Medicaid Other

## 2016-12-27 VITALS — BP 134/90 | HR 101 | Wt 163.3 lb

## 2016-12-27 DIAGNOSIS — E663 Overweight: Secondary | ICD-10-CM | POA: Diagnosis not present

## 2016-12-27 DIAGNOSIS — Z30431 Encounter for routine checking of intrauterine contraceptive device: Secondary | ICD-10-CM

## 2016-12-27 MED ORDER — CYANOCOBALAMIN 1000 MCG/ML IJ SOLN
1000.0000 ug | Freq: Once | INTRAMUSCULAR | Status: AC
  Administered 2016-12-27: 1000 ug via INTRAMUSCULAR

## 2016-12-27 NOTE — Progress Notes (Signed)
Pt is here for wt, bp check, b- 12 inj Denies any s/e   12/27/16 wt-163.3lb 12/03/16 wt- 173.3lb

## 2017-01-24 ENCOUNTER — Ambulatory Visit: Payer: Self-pay | Admitting: Obstetrics and Gynecology

## 2017-12-05 ENCOUNTER — Encounter: Payer: Self-pay | Admitting: Obstetrics and Gynecology

## 2017-12-07 ENCOUNTER — Encounter: Payer: Self-pay | Admitting: Obstetrics and Gynecology

## 2017-12-07 ENCOUNTER — Ambulatory Visit (INDEPENDENT_AMBULATORY_CARE_PROVIDER_SITE_OTHER): Payer: Medicaid Other | Admitting: Obstetrics and Gynecology

## 2017-12-07 VITALS — BP 138/90 | HR 85 | Ht 62.5 in | Wt 191.3 lb

## 2017-12-07 DIAGNOSIS — Z6834 Body mass index (BMI) 34.0-34.9, adult: Secondary | ICD-10-CM

## 2017-12-07 DIAGNOSIS — E669 Obesity, unspecified: Secondary | ICD-10-CM

## 2017-12-07 MED ORDER — PHENTERMINE HCL 37.5 MG PO TABS: 37.5000 mg | ORAL_TABLET | Freq: Every day | ORAL | 2 refills | Status: DC

## 2017-12-07 MED ORDER — CYANOCOBALAMIN 1000 MCG/ML IJ SOLN: 1000.0000 ug | INTRAMUSCULAR | 1 refills | Status: DC

## 2017-12-07 NOTE — Progress Notes (Signed)
Subjective:  Livingston Dionesllison Amory is a 47 y.o. G1P1001 at Unknown being seen today for weight loss management- initial visit.  Patient reports General ROS: negative and reports previous weight loss attempts: were successful with adipex. Desires restarting   Previous/Current treatment includes: small frequent feedings, nutritional supplement, vitamin supplement, vitamin B-12 injections and appetite suppressant.    The following portions of the patient's history were reviewed and updated as appropriate: allergies, current medications, past family history, past medical history, past social history, past surgical history and problem list.   Objective:   Vitals:   12/07/17 1035  BP: 138/90  Pulse: 85  Weight: 191 lb 4.8 oz (86.8 kg)  Height: 5' 2.5" (1.588 m)    General:  Alert, oriented and cooperative. Patient is in no acute distress.  :   :   :   :   :   :   PE: Well groomed female in no current distress,   Mental Status: Normal mood and affect. Normal behavior. Normal judgment and thought content.   Current BMI: Body mass index is 34.43 kg/m.   Assessment and Plan:  Obesity  There are no diagnoses linked to this encounter.  Plan: low carb, High protein diet RX for adipex 37.5 mg daily and B12 1000mcg.ml monthly, to start now with first injection given at today's visit. Reviewed side-effects common to both medications and expected outcomes. Increase daily water intake to at least 8 bottle a day, every day.  Goal is to reduse weight by 10% by end of three months, and will re-evaluate then.  RTC in 8 weeks for annual exam visit to check weight & BP, and will get next B12 injections. Will get school nurse to check BP and give B12 injection in 4 weeks.    Please refer to After Visit Summary for other counseling recommendations.    MilanShambley, Melody N, CNM   Melody Krotz SpringsN Shambley, CNM      Consider the Low Glycemic Index Diet and 6 smaller meals daily .  This boosts your  metabolism and regulates your sugars:   Use the protein bar by Atkins because they have lots of fiber in them  Find the low carb flatbreads, tortillas and pita breads for sandwiches:  Joseph's makes a pita bread and a flat bread , available at [facility] and [employer]; Toufayah makes a low carb flatbread available at [employer] and HT that is 9 net carbs and 100 cal Mission makes a low carb whole wheat tortilla available at [employer],and most grocery stores with 6 net carbs and 210 cal  [place] yogurt can still have a lot of carbs .  Dannon Light N fit has 80 cal and 8 carbs

## 2017-12-07 NOTE — Patient Instructions (Signed)

## 2017-12-28 ENCOUNTER — Encounter: Payer: Medicaid Other | Admitting: Obstetrics and Gynecology

## 2018-01-16 NOTE — Progress Notes (Signed)
Pt presents today for Wt, BP and B12 injection. However pt stated she had already received her B12 injection at her pharmacy. Pt has lost 10 lbs lbs since last visit. Diet: Eats healthy Exercise: Minimal Side Effects: NA  Pt received Flu vaccine today by [facility]

## 2018-01-18 ENCOUNTER — Ambulatory Visit (INDEPENDENT_AMBULATORY_CARE_PROVIDER_SITE_OTHER): Payer: Medicaid Other | Admitting: Obstetrics and Gynecology

## 2018-01-18 VITALS — BP 138/89 | HR 112 | Ht 62.5 in | Wt 181.0 lb

## 2018-01-18 DIAGNOSIS — E669 Obesity, unspecified: Secondary | ICD-10-CM

## 2018-01-18 DIAGNOSIS — Z23 Encounter for immunization: Secondary | ICD-10-CM | POA: Diagnosis not present

## 2018-02-14 ENCOUNTER — Ambulatory Visit (INDEPENDENT_AMBULATORY_CARE_PROVIDER_SITE_OTHER): Payer: Medicaid Other | Admitting: Obstetrics and Gynecology

## 2018-02-14 ENCOUNTER — Encounter: Payer: Self-pay | Admitting: Obstetrics and Gynecology

## 2018-02-14 VITALS — BP 129/86 | HR 86 | Ht 62.5 in | Wt 182.8 lb

## 2018-02-14 DIAGNOSIS — Z30431 Encounter for routine checking of intrauterine contraceptive device: Secondary | ICD-10-CM | POA: Diagnosis not present

## 2018-02-14 DIAGNOSIS — Z0000 Encounter for general adult medical examination without abnormal findings: Secondary | ICD-10-CM | POA: Diagnosis not present

## 2018-02-14 DIAGNOSIS — Z01419 Encounter for gynecological examination (general) (routine) without abnormal findings: Secondary | ICD-10-CM | POA: Diagnosis not present

## 2018-02-14 MED ORDER — CETIRIZINE HCL 10 MG PO CAPS: 1.0000 | ORAL_CAPSULE | Freq: Every day | ORAL | 5 refills | Status: AC

## 2018-02-14 MED ORDER — OLOPATADINE HCL 0.2 % OP SOLN: 2.0000 [drp] | Freq: Every day | OPHTHALMIC | 6 refills | Status: AC

## 2018-02-14 MED ORDER — CYANOCOBALAMIN 1000 MCG/ML IJ SOLN: 1000.0000 ug | INTRAMUSCULAR | 1 refills | Status: AC

## 2018-02-14 MED ORDER — FLUTICASONE PROPIONATE 50 MCG/ACT NA SUSP: 2.0000 | Freq: Every day | NASAL | 6 refills | Status: AC

## 2018-02-14 MED ORDER — PHENTERMINE HCL 37.5 MG PO TABS: 37.5000 mg | ORAL_TABLET | Freq: Every day | ORAL | 2 refills | Status: DC

## 2018-02-14 NOTE — Progress Notes (Signed)
Subjective:   [name] is a 47 y.o. G61P1001 Caucasian female here for a routine well-woman exam.  No LMP recorded. (Menstrual status: IUD).    Current complaints: heavy one day of bleeding this past month.  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  Past Medical History Past Medical History:  Diagnosis Date  . Hx of essential hypertension   . Irregular menses   . Seasonal allergies     Past Surgical History Past Surgical History:  Procedure Laterality Date  . CESAREAN SECTION    . cesearean sec    . LAPAROSCOPY     exploratory    Gynecologic History G1P1001  No LMP recorded. (Menstrual status: IUD). Contraception: abstinence & IUD (placed 2017) Last Pap: 2018. Results were: normal Last mammogram: 2017. Results were: normal   Obstetric History OB History  Gravida Para Term Preterm AB Living  1 1 1     1   SAB TAB Ectopic Multiple Live Births          1    # Outcome Date GA Lbr Len/2nd Weight Sex Delivery Anes PTL Lv  1 Term 04/16/07 [redacted]w[redacted]d  7 lb 8 oz (3.402 kg) M CS-Unspec  N LIV    Current Medications Current Outpatient Medications on File Prior to Visit  Medication Sig Dispense Refill  . cyanocobalamin (,VITAMIN B-12,) 1000 MCG/ML injection Inject 1 mL (1,000 mcg total) into the muscle every 30 (thirty) days. 10 mL 1  . levonorgestrel (MIRENA) 20 MCG/24HR IUD by Intrauterine route.    . Olopatadine HCl 0.2 % SOLN Apply to eye.    . phentermine (ADIPEX-P) 37.5 MG tablet Take 1 tablet (37.5 mg total) by mouth  daily before breakfast. 30 tablet 2  . Cetirizine HCl 10 MG CAPS Take by mouth.    . fluticasone (FLONASE) 50 MCG/ACT nasal spray Place into the nose.     No current facility-administered medications on file prior to visit.     Review of Systems Patient denies any headaches, blurred vision, shortness of breath, chest pain, abdominal pain, problems with bowel movements, urination, or intercourse.  Objective:  BP 129/86   Pulse 86   Ht 5' 2.5" (1.588 m)   Wt 182 lb 12.8 oz (82.9 kg)   BMI 32.90 kg/m  Physical Exam  General:  Well developed, well nourished, no acute distress. She is alert and oriented x3. Skin:  Warm and dry Neck:  Midline trachea, no thyromegaly or nodules Cardiovascular: Regular rate and rhythm, no murmur heard Lungs:  Effort normal, all lung fields clear to auscultation bilaterally Breasts:  No dominant palpable mass, retraction, or nipple discharge Abdomen:  Soft, non tender, no hepatosplenomegaly or masses Pelvic:  External genitalia is normal in appearance.  The vagina is normal in appearance. The cervix is bulbous, no CMT.  Thin prep pap is not done. IUD string not seen. Uterus is felt to be normal size, shape, and contour.  No adnexal masses or tenderness noted on right but is noted on left side with uterus shifted to the left. Extremities:  No swelling or varicosities  noted Psych:  She has a normal mood and affect  Assessment:   Healthy well-woman exam Needs flu vacine IUD check (placed 2017) Obesity  Plan:  meds refilled Flu vaccine given RTC in 1 week for pelvic u/s to check IUD  Labs obtained will follow up accordingly F/U 1 year for AE, or sooner if needed Mammogram ordered  Melody Suzan Nailer, CNM

## 2018-02-15 LAB — THYROID PANEL WITH TSH
Free Thyroxine Index: 2.3 (ref 1.2–4.9)
T3 Uptake Ratio: 31 % (ref 24–39)
T4 TOTAL: 7.3 ug/dL (ref 4.5–12.0)
TSH: 1.2 u[IU]/mL (ref 0.450–4.500)

## 2018-02-15 LAB — COMPREHENSIVE METABOLIC PANEL
ALBUMIN: 4.4 g/dL (ref 3.5–5.5)
ALK PHOS: 64 IU/L (ref 39–117)
ALT: 13 IU/L (ref 0–32)
AST: 14 IU/L (ref 0–40)
Albumin/Globulin Ratio: 2 (ref 1.2–2.2)
BUN / CREAT RATIO: 18 (ref 9–23)
BUN: 14 mg/dL (ref 6–24)
Bilirubin Total: 0.9 mg/dL (ref 0.0–1.2)
CO2: 22 mmol/L (ref 20–29)
CREATININE: 0.79 mg/dL (ref 0.57–1.00)
Calcium: 9.6 mg/dL (ref 8.7–10.2)
Chloride: 99 mmol/L (ref 96–106)
GFR, EST AFRICAN AMERICAN: 103 mL/min/{1.73_m2} (ref >59)
GFR, EST NON AFRICAN AMERICAN: 89 mL/min/{1.73_m2} (ref >59)
GLOBULIN, TOTAL: 2.2 g/dL (ref 1.5–4.5)
Glucose: 100 mg/dL — ABNORMAL HIGH (ref 65–99)
Potassium: 3.6 mmol/L (ref 3.5–5.2)
SODIUM: 139 mmol/L (ref 134–144)
TOTAL PROTEIN: 6.6 g/dL (ref 6.0–8.5)

## 2018-02-15 LAB — LIPID PANEL
CHOL/HDL RATIO: 4.7 ratio — AB (ref 0.0–4.4)
Cholesterol, Total: 199 mg/dL (ref 100–199)
HDL: 42 mg/dL (ref >39)
LDL CALC: 140 mg/dL — AB (ref 0–99)
Triglycerides: 83 mg/dL (ref 0–149)
VLDL CHOLESTEROL CAL: 17 mg/dL (ref 5–40)

## 2018-02-15 LAB — HEMOGLOBIN A1C
Est. average glucose Bld gHb Est-mCnc: 97 mg/dL
Hgb A1c MFr Bld: 5 % (ref 4.8–5.6)

## 2018-02-21 ENCOUNTER — Ambulatory Visit (INDEPENDENT_AMBULATORY_CARE_PROVIDER_SITE_OTHER): Payer: Medicaid Other

## 2018-02-21 ENCOUNTER — Other Ambulatory Visit: Payer: Self-pay | Admitting: Obstetrics and Gynecology

## 2018-02-21 DIAGNOSIS — Z30431 Encounter for routine checking of intrauterine contraceptive device: Secondary | ICD-10-CM

## 2018-02-21 DIAGNOSIS — Z1231 Encounter for screening mammogram for malignant neoplasm of breast: Secondary | ICD-10-CM

## 2018-02-22 ENCOUNTER — Other Ambulatory Visit: Payer: Self-pay | Admitting: Obstetrics and Gynecology

## 2018-02-22 DIAGNOSIS — N6489 Other specified disorders of breast: Secondary | ICD-10-CM

## 2018-03-13 ENCOUNTER — Other Ambulatory Visit: Payer: Self-pay

## 2018-03-14 ENCOUNTER — Encounter: Payer: Medicaid Other | Admitting: Obstetrics and Gynecology

## 2018-03-16 ENCOUNTER — Other Ambulatory Visit: Payer: Self-pay | Admitting: Obstetrics and Gynecology

## 2018-03-22 ENCOUNTER — Encounter: Payer: Medicaid Other | Admitting: Obstetrics and Gynecology

## 2018-03-22 ENCOUNTER — Other Ambulatory Visit: Payer: Self-pay

## 2018-04-21 ENCOUNTER — Telehealth: Payer: Self-pay | Admitting: Obstetrics and Gynecology

## 2018-04-21 NOTE — Telephone Encounter (Signed)
The patients pharmacy was changed and she would like to have her phentermine (ADIPEX-P) 37.5 MG tablet sent to [facility] on [address]. The patient is aware that Melody will not be available to do so until she is back in the office on 04/25/18. Please advise.

## 2018-04-28 ENCOUNTER — Ambulatory Visit (INDEPENDENT_AMBULATORY_CARE_PROVIDER_SITE_OTHER): Payer: Medicaid Other | Admitting: Obstetrics and Gynecology

## 2018-04-28 ENCOUNTER — Encounter: Payer: Medicaid Other | Admitting: Obstetrics and Gynecology

## 2018-04-28 ENCOUNTER — Encounter: Payer: Self-pay | Admitting: Obstetrics and Gynecology

## 2018-04-28 VITALS — BP 128/84 | HR 98 | Ht 62.5 in | Wt 177.3 lb

## 2018-04-28 DIAGNOSIS — E669 Obesity, unspecified: Secondary | ICD-10-CM | POA: Diagnosis not present

## 2018-04-28 MED ORDER — PHENTERMINE HCL 37.5 MG PO TABS: 37.5000 mg | ORAL_TABLET | Freq: Every day | ORAL | 2 refills | Status: AC

## 2018-04-28 MED ORDER — CYANOCOBALAMIN 1000 MCG/ML IJ SOLN
1000.0000 ug | Freq: Once | INTRAMUSCULAR | Status: AC
  Administered 2018-04-28: 1000 ug via INTRAMUSCULAR

## 2018-04-28 NOTE — Progress Notes (Signed)
Pt is here for wt, bp check, b-12 inj She is doing well, denies any s/e Desires refill on phentermine  04/28/18 wt- 177.3lb 02/14/18 182.12 lb  Waist 35.5  Doing well with weight loss. Will continue at this time.    Melody Shambley,CNM

## 2018-05-26 ENCOUNTER — Encounter: Payer: Self-pay | Admitting: Obstetrics and Gynecology

## 2018-05-26 ENCOUNTER — Ambulatory Visit (INDEPENDENT_AMBULATORY_CARE_PROVIDER_SITE_OTHER): Payer: Medicaid Other | Admitting: Obstetrics and Gynecology

## 2018-05-26 VITALS — BP 132/92 | HR 79 | Ht 62.5 in | Wt 175.8 lb

## 2018-05-26 DIAGNOSIS — E669 Obesity, unspecified: Secondary | ICD-10-CM

## 2018-05-26 MED ORDER — CYANOCOBALAMIN 1000 MCG/ML IJ SOLN
1000.0000 ug | Freq: Once | INTRAMUSCULAR | Status: AC
  Administered 2018-05-26: 1000 ug via INTRAMUSCULAR

## 2018-05-26 NOTE — Progress Notes (Signed)
Pt is here for wt, bp check, b-12 inj She is doing well, denies any s/e  05/26/18 wt- 175.8lb 04/28/18 wt- 177lb  Waist 34 in

## 2018-06-21 ENCOUNTER — Encounter: Payer: Medicaid Other | Admitting: Obstetrics and Gynecology

## 2018-07-04 ENCOUNTER — Ambulatory Visit: Payer: Medicaid Other | Admitting: Obstetrics and Gynecology

## 2018-07-04 ENCOUNTER — Encounter: Payer: Self-pay | Admitting: Obstetrics and Gynecology

## 2018-07-04 VITALS — BP 132/90 | HR 103 | Ht 63.0 in | Wt 175.3 lb

## 2018-07-04 DIAGNOSIS — E669 Obesity, unspecified: Secondary | ICD-10-CM

## 2018-07-04 DIAGNOSIS — Z79899 Other long term (current) drug therapy: Secondary | ICD-10-CM

## 2018-07-04 DIAGNOSIS — R030 Elevated blood-pressure reading, without diagnosis of hypertension: Secondary | ICD-10-CM

## 2018-07-04 MED ORDER — BLOOD PRESSURE KIT DEVI: 1.0000 | Freq: Once | 0 refills | Status: AC

## 2018-07-04 MED ORDER — CYANOCOBALAMIN 1000 MCG/ML IJ SOLN
1000.0000 ug | Freq: Once | INTRAMUSCULAR | Status: AC
  Administered 2018-07-04: 1000 ug via INTRAMUSCULAR

## 2018-07-04 NOTE — Patient Instructions (Signed)
Managing Your Hypertension  Hypertension is commonly called high blood pressure. This is when the force of your blood pressing against the walls of your arteries is too strong. Arteries are blood vessels that carry blood from your heart throughout your body. Hypertension forces the heart to work harder to pump blood, and may cause the arteries to become narrow or stiff. Having untreated or uncontrolled hypertension can cause heart attack, stroke, kidney disease, and other problems.  What are blood pressure readings?  A blood pressure reading consists of a higher number over a lower number. Ideally, your blood pressure should be below 120/80. The first ("top") number is called the systolic pressure. It is a measure of the pressure in your arteries as your heart beats. The second ("bottom") number is called the diastolic pressure. It is a measure of the pressure in your arteries as the heart relaxes.  What does my blood pressure reading mean?  Blood pressure is classified into four stages. Based on your blood pressure reading, your health care provider may use the following stages to determine what type of treatment you need, if any. Systolic pressure and diastolic pressure are measured in a unit called mm Hg.  Normal   Systolic pressure: below 120.   Diastolic pressure: below 80.  Elevated   Systolic pressure: 120-129.   Diastolic pressure: below 80.  Hypertension stage 1   Systolic pressure: 130-139.   Diastolic pressure: 80-89.  Hypertension stage 2   Systolic pressure: 140 or above.   Diastolic pressure: 90 or above.  What health risks are associated with hypertension?  Managing your hypertension is an important responsibility. Uncontrolled hypertension can lead to:   A heart attack.   A stroke.   A weakened blood vessel (aneurysm).   Heart failure.   Kidney damage.   Eye damage.   Metabolic syndrome.   Memory and concentration problems.  What changes can I make to manage my  hypertension?  Hypertension can be managed by making lifestyle changes and possibly by taking medicines. Your health care provider will help you make a plan to bring your blood pressure within a normal range.  Eating and drinking     Eat a diet that is high in fiber and potassium, and low in salt (sodium), added sugar, and fat. An example eating plan is called the DASH (Dietary Approaches to Stop Hypertension) diet. To eat this way:  ? Eat plenty of fresh fruits and vegetables. Try to fill half of your plate at each meal with fruits and vegetables.  ? Eat whole grains, such as whole wheat pasta, brown rice, or whole grain bread. Fill about one quarter of your plate with whole grains.  ? Eat low-fat diary products.  ? Avoid fatty cuts of meat, processed or cured meats, and poultry with skin. Fill about one quarter of your plate with lean proteins such as fish, chicken without skin, beans, eggs, and tofu.  ? Avoid premade and processed foods. These tend to be higher in sodium, added sugar, and fat.   Reduce your daily sodium intake. Most people with hypertension should eat less than 1,500 mg of sodium a day.   Limit alcohol intake to no more than 1 drink a day for nonpregnant women and 2 drinks a day for men. One drink equals 12 oz of beer, 5 oz of wine, or 1 oz of hard liquor.  Lifestyle   Work with your health care provider to maintain a healthy body weight, or to lose   weight. Ask what an ideal weight is for you.   Get at least 30 minutes of exercise that causes your heart to beat faster (aerobic exercise) most days of the week. Activities may include walking, swimming, or biking.   Include exercise to strengthen your muscles (resistance exercise), such as weight lifting, as part of your weekly exercise routine. Try to do these types of exercises for 30 minutes at least 3 days a week.   Do not use any products that contain nicotine or tobacco, such as cigarettes and e-cigarettes. If you need help quitting,  ask your health care provider.   Control any long-term (chronic) conditions you have, such as high cholesterol or diabetes.  Monitoring   Monitor your blood pressure at home as told by your health care provider. Your personal target blood pressure may vary depending on your medical conditions, your age, and other factors.   Have your blood pressure checked regularly, as often as told by your health care provider.  Working with your health care provider   Review all the medicines you take with your health care provider because there may be side effects or interactions.   Talk with your health care provider about your diet, exercise habits, and other lifestyle factors that may be contributing to hypertension.   Visit your health care provider regularly. Your health care provider can help you create and adjust your plan for managing hypertension.  Will I need medicine to control my blood pressure?  Your health care provider may prescribe medicine if lifestyle changes are not enough to get your blood pressure under control, and if:   Your systolic blood pressure is 130 or higher.   Your diastolic blood pressure is 80 or higher.  Take medicines only as told by your health care provider. Follow the directions carefully. Blood pressure medicines must be taken as prescribed. The medicine does not work as well when you skip doses. Skipping doses also puts you at risk for problems.  Contact a health care provider if:   You think you are having a reaction to medicines you have taken.   You have repeated (recurrent) headaches.   You feel dizzy.   You have swelling in your ankles.   You have trouble with your vision.  Get help right away if:   You develop a severe headache or confusion.   You have unusual weakness or numbness, or you feel faint.   You have severe pain in your chest or abdomen.   You vomit repeatedly.   You have trouble breathing.  Summary   Hypertension is when the force of blood pumping  through your arteries is too strong. If this condition is not controlled, it may put you at risk for serious complications.   Your personal target blood pressure may vary depending on your medical conditions, your age, and other factors. For most people, a normal blood pressure is less than 120/80.   Hypertension is managed by lifestyle changes, medicines, or both. Lifestyle changes include weight loss, eating a healthy, low-sodium diet, exercising more, and limiting alcohol.  This information is not intended to replace advice given to you by your health care provider. Make sure you discuss any questions you have with your health care provider.  Document Released: 01/12/2012 Document Revised: 03/17/2016 Document Reviewed: 03/17/2016  Elsevier Interactive Patient Education  2019 [employer]

## 2018-07-04 NOTE — Progress Notes (Signed)
Pt is here for wt, bp check, b-12 inj She is doing well, denies any s/e  07/04/18 wt- 175.3lb 05/26/18 wt- 175lb  Waist- 34.5 in   States she had a good week last week, son is doing well.   Has been walking at the park more. Son now has assistance with autism, and now able to go to the [facility], so she will pick up exercise.  Patient denies any s/s of elevated blood pressures at home, typically reading are better at end of visit.  Will order home BP machine and check BPs at least every other day. Will MyChart me readings every week. May consider restarting BP meds and/or stopping adipex. 140/100 with recheck on discharge 132/90.  RTC 4 weeks for BP check and medication management. Understands needs to lose 5% of weight in next 2 months and BP has to be stable to continue phentermine in general.  Melody Shambley,CNM

## 2018-07-13 DIAGNOSIS — H5213 Myopia, bilateral: Secondary | ICD-10-CM | POA: Diagnosis not present

## 2018-07-27 DIAGNOSIS — H524 Presbyopia: Secondary | ICD-10-CM | POA: Diagnosis not present

## 2018-08-02 ENCOUNTER — Encounter: Payer: Medicaid Other | Admitting: Obstetrics and Gynecology

## 2018-09-20 ENCOUNTER — Encounter: Payer: Self-pay | Admitting: Obstetrics and Gynecology

## 2018-09-22 ENCOUNTER — Encounter: Payer: Self-pay | Admitting: Obstetrics and Gynecology

## 2018-12-27 ENCOUNTER — Other Ambulatory Visit: Payer: Self-pay | Admitting: *Deleted

## 2019-01-03 ENCOUNTER — Other Ambulatory Visit: Payer: Self-pay | Admitting: Obstetrics and Gynecology

## 2019-01-03 MED ORDER — RIZATRIPTAN BENZOATE 10 MG PO TABS: 10.0000 mg | ORAL_TABLET | Freq: Once | ORAL | 2 refills | Status: AC | PRN

## 2019-01-05 ENCOUNTER — Telehealth: Payer: Self-pay | Admitting: Obstetrics and Gynecology

## 2019-01-05 NOTE — Telephone Encounter (Signed)
Cary from [facility] called and stated that he needs to speak with the nurse of [facility] in regards to the patient. Cary's contact information is [phone]. Please advise.

## 2019-02-16 ENCOUNTER — Encounter: Payer: Medicaid Other | Admitting: Obstetrics and Gynecology

## 2019-04-04 ENCOUNTER — Telehealth: Payer: Self-pay | Admitting: Surgical

## 2019-04-04 NOTE — Telephone Encounter (Signed)
We received a medicaid request form for patient to receive [address] mask. Patient was a [facility] patient. Spoke with Dr. Marcelline Mates and since patient is not pregnant form needs to go through her PCP. Patient verbalized understanding and will contact medicaid form them to send to PCP.

## 2019-05-02 ENCOUNTER — Ambulatory Visit: Payer: Medicaid Other | Attending: Internal Medicine

## 2019-05-02 DIAGNOSIS — Z20828 Contact with and (suspected) exposure to other viral communicable diseases: Secondary | ICD-10-CM | POA: Diagnosis not present

## 2019-05-02 DIAGNOSIS — Z20822 Contact with and (suspected) exposure to covid-19: Secondary | ICD-10-CM

## 2019-05-03 LAB — NOVEL CORONAVIRUS, NAA: SARS-CoV-2, NAA: NOT DETECTED

## 2019-07-19 DIAGNOSIS — M797 Fibromyalgia: Secondary | ICD-10-CM | POA: Diagnosis not present

## 2019-09-07 ENCOUNTER — Ambulatory Visit: Payer: Medicaid Other | Attending: Internal Medicine

## 2019-09-07 DIAGNOSIS — Z20822 Contact with and (suspected) exposure to covid-19: Secondary | ICD-10-CM | POA: Diagnosis not present

## 2019-09-08 LAB — SARS-COV-2, NAA 2 DAY TAT

## 2019-09-08 LAB — NOVEL CORONAVIRUS, NAA: SARS-CoV-2, NAA: NOT DETECTED

## 2019-09-10 ENCOUNTER — Other Ambulatory Visit: Payer: Medicaid Other

## 2019-11-06 ENCOUNTER — Ambulatory Visit (LOCAL_COMMUNITY_HEALTH_CENTER): Payer: Self-pay

## 2019-11-06 ENCOUNTER — Other Ambulatory Visit: Payer: Self-pay

## 2019-11-06 DIAGNOSIS — Z111 Encounter for screening for respiratory tuberculosis: Secondary | ICD-10-CM

## 2019-11-09 ENCOUNTER — Other Ambulatory Visit: Payer: Self-pay

## 2019-11-09 ENCOUNTER — Ambulatory Visit (LOCAL_COMMUNITY_HEALTH_CENTER): Payer: Medicaid Other

## 2019-11-09 DIAGNOSIS — Z111 Encounter for screening for respiratory tuberculosis: Secondary | ICD-10-CM

## 2019-11-09 LAB — TB SKIN TEST
Induration: 0 mm
TB Skin Test: NEGATIVE

## 2020-03-12 ENCOUNTER — Encounter: Payer: Medicaid Other | Admitting: Certified Nurse Midwife

## 2020-05-21 ENCOUNTER — Encounter: Payer: Medicaid Other | Admitting: Certified Nurse Midwife

## 2020-07-10 DIAGNOSIS — M545 Low back pain, unspecified: Secondary | ICD-10-CM | POA: Diagnosis not present

## 2020-08-05 DIAGNOSIS — M5441 Lumbago with sciatica, right side: Secondary | ICD-10-CM | POA: Diagnosis not present

## 2020-08-05 DIAGNOSIS — M5442 Lumbago with sciatica, left side: Secondary | ICD-10-CM | POA: Diagnosis not present

## 2020-08-13 DIAGNOSIS — M5441 Lumbago with sciatica, right side: Secondary | ICD-10-CM | POA: Diagnosis not present

## 2020-08-13 DIAGNOSIS — M5442 Lumbago with sciatica, left side: Secondary | ICD-10-CM | POA: Diagnosis not present

## 2020-09-01 DIAGNOSIS — M5441 Lumbago with sciatica, right side: Secondary | ICD-10-CM | POA: Diagnosis not present

## 2020-09-01 DIAGNOSIS — M5442 Lumbago with sciatica, left side: Secondary | ICD-10-CM | POA: Diagnosis not present

## 2020-09-09 ENCOUNTER — Other Ambulatory Visit: Payer: Self-pay | Admitting: Family Medicine

## 2020-09-09 ENCOUNTER — Other Ambulatory Visit (HOSPITAL_COMMUNITY): Payer: Self-pay | Admitting: Family Medicine

## 2020-09-09 DIAGNOSIS — M542 Cervicalgia: Secondary | ICD-10-CM

## 2020-09-09 DIAGNOSIS — M5416 Radiculopathy, lumbar region: Secondary | ICD-10-CM

## 2020-09-09 DIAGNOSIS — M5412 Radiculopathy, cervical region: Secondary | ICD-10-CM

## 2020-09-16 ENCOUNTER — Ambulatory Visit: Payer: Medicaid Other

## 2020-09-30 ENCOUNTER — Ambulatory Visit
Admission: RE | Admit: 2020-09-30 | Discharge: 2020-09-30 | Disposition: A | Discharge status: Home / Self Care | Payer: Medicaid Other | Source: Ambulatory Visit | Attending: Family Medicine | Admitting: Family Medicine

## 2020-09-30 ENCOUNTER — Other Ambulatory Visit: Payer: Self-pay

## 2020-09-30 DIAGNOSIS — M5416 Radiculopathy, lumbar region: Secondary | ICD-10-CM

## 2020-09-30 DIAGNOSIS — M5412 Radiculopathy, cervical region: Secondary | ICD-10-CM | POA: Insufficient documentation

## 2020-09-30 DIAGNOSIS — M542 Cervicalgia: Secondary | ICD-10-CM | POA: Diagnosis present

## 2020-09-30 IMAGING — MR MR CERVICAL SPINE W/O CM
5 series · 36 of 48 positions shown · non-contrast
Comparison: Report from radiographs of the cervical spine performed
[DATE] (images unavailable)

CLINICAL DATA: Cervicalgia. Cervical radiculitis. Additional
history provided by scanning technologist: Patient reports neck pain
worse on the right with headaches symptoms for years.

EXAM:
MRI CERVICAL SPINE WITHOUT CONTRAST
TECHNIQUE: Multiplanar, multisequence MR imaging of the cervical spine was
performed. No intravenous contrast was administered.

[Series 5: T2 · sagittal · 3.0mm · 0.62mm/px · 6 of 15 slices shown (1 of 2)]
[im 1/15]
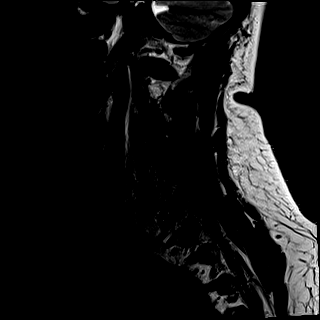
[im 3/15]
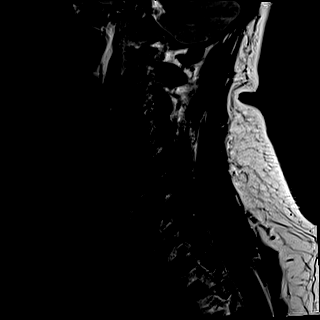
[im 6/15]
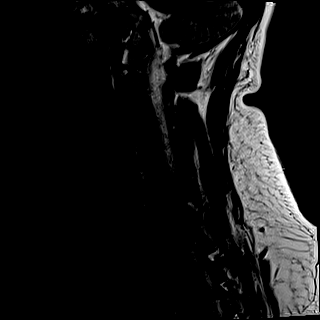
[im 9/15]
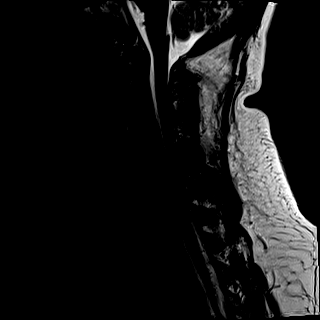
[im 12/15]
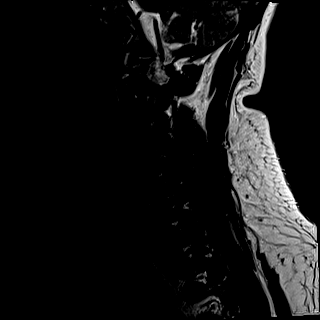
[im 15/15]
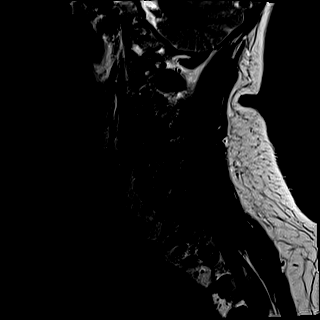

[Series 6: FLAIR · sagittal · 3.0mm · 0.78mm/px · 7 of 15 slices shown]
[im 1/15]
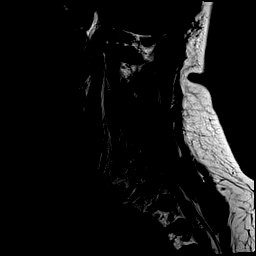
[im 3/15]
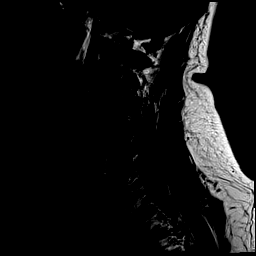
[im 5/15]
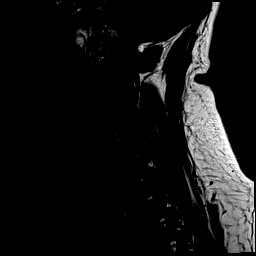
[im 8/15]
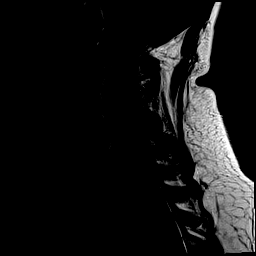
[im 10/15]
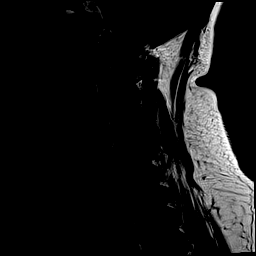
[im 12/15]
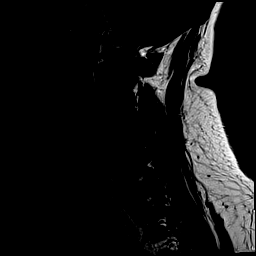
[im 15/15]
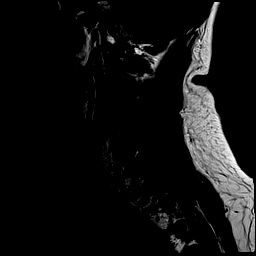

[Series 7: STIR · sagittal · 3.0mm · 0.62mm/px · 7 of 15 slices shown]
[im 1/15]
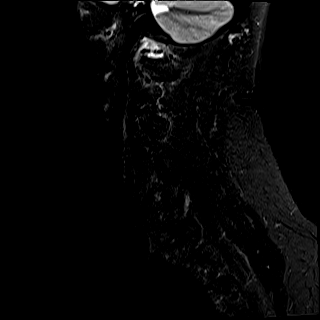
[im 3/15]
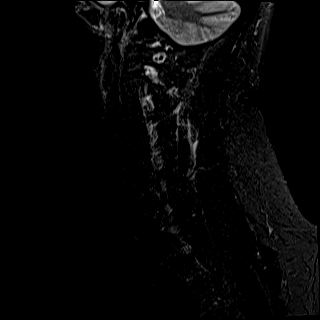
[im 5/15]
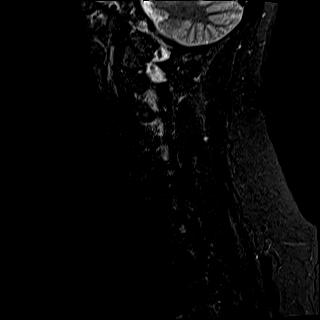
[im 8/15]
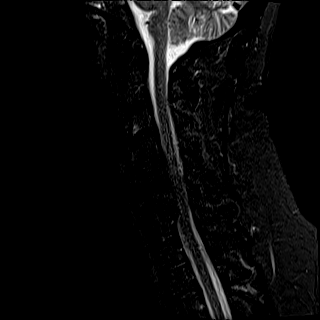
[im 10/15]
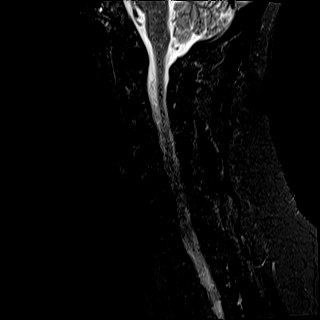
[im 12/15]
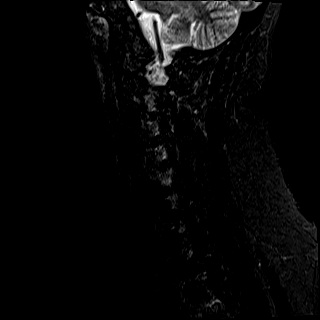
[im 15/15]
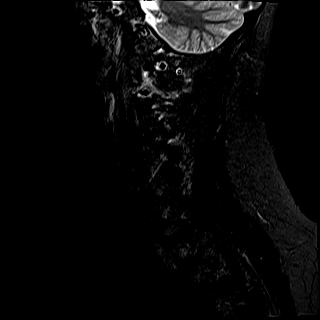

[Series 8: T2 · axial · 3.0mm · 0.70mm/px · z∈[-138,-42]mm · 8 of 29 slices shown (2 of 2)]
[im 1/29]
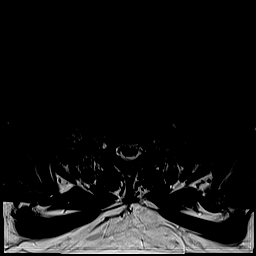
[im 5/29]
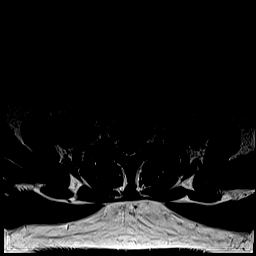
[im 9/29]
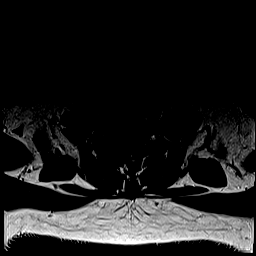
[im 13/29]
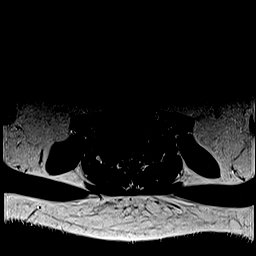
[im 16/29]
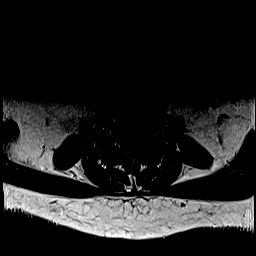
[im 20/29]
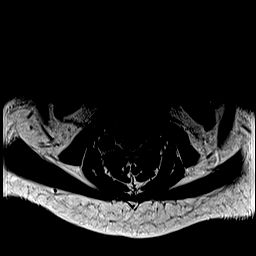
[im 24/29]
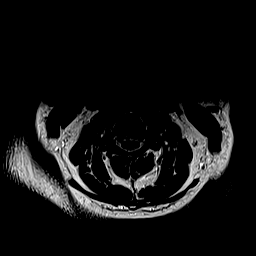
[im 29/29]
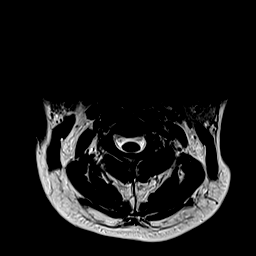

[Series 9: ax mpgr · axial · 3.0mm · 0.35mm/px · z∈[-138,-42]mm · 8 of 29 slices shown]
[im 1/29]
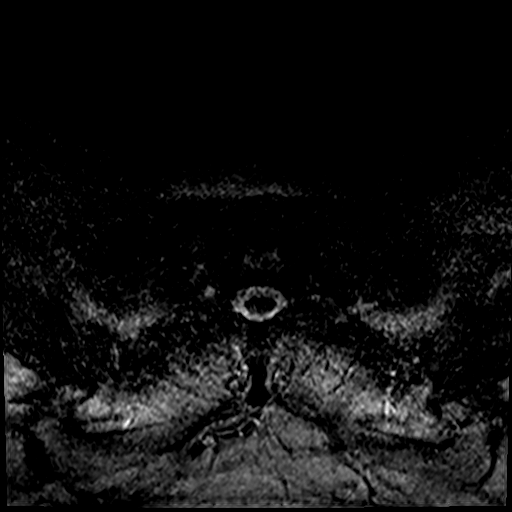
[im 5/29]
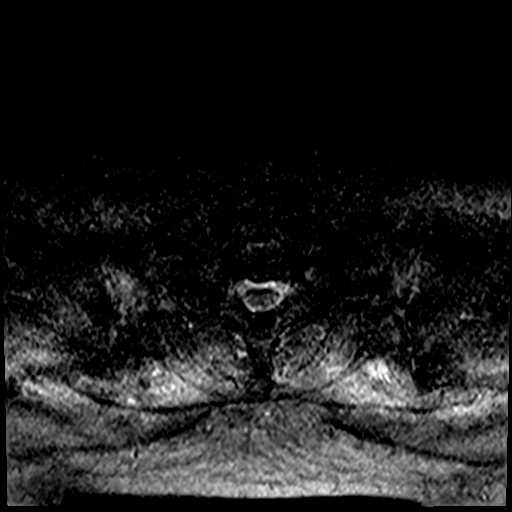
[im 9/29]
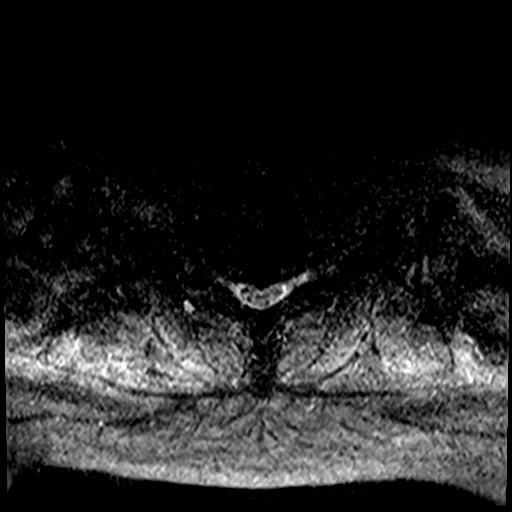
[im 13/29]
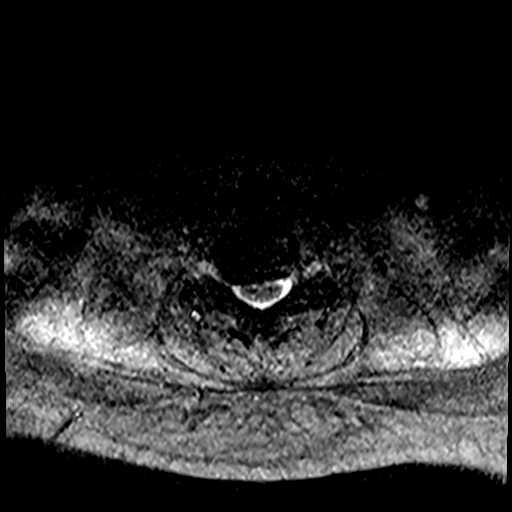
[im 16/29]
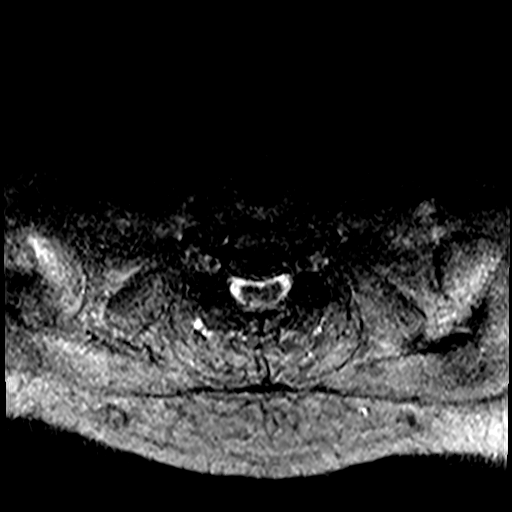
[im 20/29]
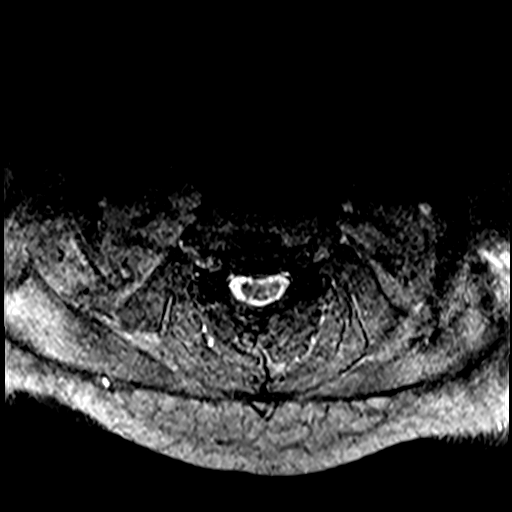
[im 24/29]
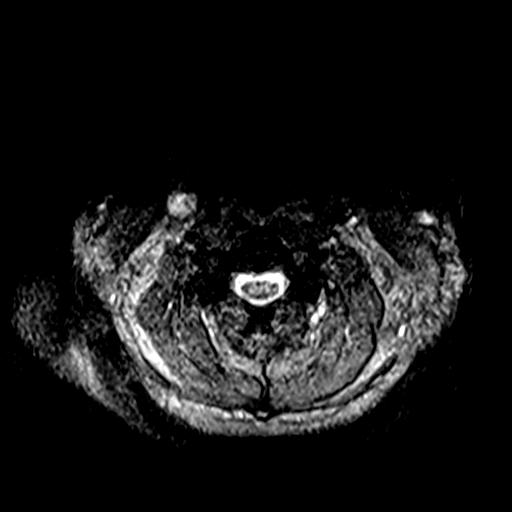
[im 29/29]
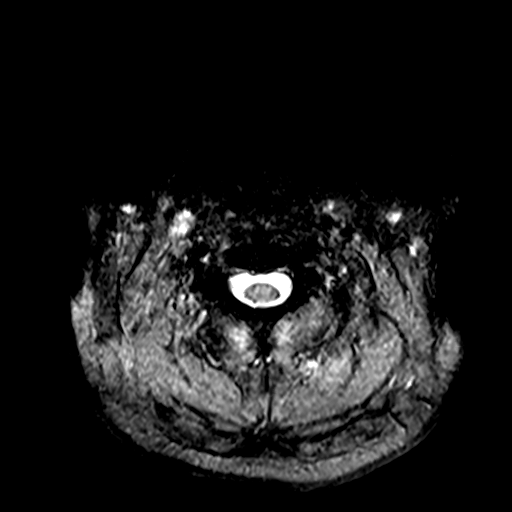

[36 of 48 positions shown; findings below may reference images not displayed]

FINDINGS: Intermittently motion degraded examination. Most notably, there is
moderate motion degradation of the sagittal T2 TSE sequence and
moderate motion degradation of the sagittal STIR sequence.

Alignment: Straightening of the expected cervical lordosis. No
significant spondylolisthesis.

Vertebrae: Vertebral body height is maintained. Multilevel
degenerative endplate irregularity and mixed degenerative endplate
marrow signal. This includes mild degenerative endplate edema at
C3-C4. Mild degenerative endplate edema at C6-C7. Elsewhere, no
significant marrow edema or focal suspicious osseous lesion is
identified. T2 vertebral body hemangioma. Multilevel ventral
osteophytes.

Cord: No spinal cord signal abnormality is identified.

Posterior Fossa, vertebral arteries, paraspinal tissues: No
abnormality identified within included portions of the posterior
fossa. Flow voids preserved within the imaged cervical vertebral
arteries. Paraspinal soft tissues within normal limits

Disc levels:

Moderate disc degeneration at C6-C7. No more than mild disc
degeneration at the remaining levels.

C2-C3: No significant disc herniation or stenosis.

C3-C4: Shallow disc bulge. No significant spinal canal or foraminal
stenosis

C4-C5: Shallow disc bulge. No significant spinal canal or foraminal
stenosis.

C5-C6: Disc bulge with right-sided disc osteophyte ridge/uncinate
hypertrophy. Moderate spinal canal stenosis with contact upon the
dorsal and ventral spinal cord. Severe right neural foraminal
narrowing.

C6-C7: Disc bulge with endplate spurring and uncovertebral
hypertrophy. Moderate/severe spinal canal stenosis with mild spinal
cord flattening. Bilateral neural foraminal narrowing (moderate
right, mild left).

C7-T1: Mild facet arthrosis. No significant disc herniation or
stenosis.
IMPRESSION: Motion degraded exam, as described and limiting evaluation.

Cervical spondylosis, as outlined and with findings most notably as
follows.

At C6-C7, there is moderate disc degeneration. Multifactorial
moderate/severe spinal canal stenosis with mild spinal cord
flattening. Bilateral neural foraminal narrowing (moderate right,
mild left).

At C5-C6, there is multifactorial moderate spinal canal stenosis
with contact upon the dorsal and ventral spinal cord. Severe right
neural foraminal narrowing.

No significant disc herniation or spinal canal stenosis at the
remaining levels.

## 2020-09-30 IMAGING — MR MR LUMBAR SPINE W/O CM
5 series · 30 of 48 positions shown · non-contrast
Comparison: None.

CLINICAL DATA: Low back pain with occasional pain in left leg.

EXAM:
MRI LUMBAR SPINE WITHOUT CONTRAST
TECHNIQUE: Multiplanar, multisequence MR imaging of the lumbar spine was
performed. No intravenous contrast was administered.

[Series 5: T2 · sagittal · 4.0mm · 0.81mm/px · 6 of 17 slices shown (1 of 2)]
[im 1/17]
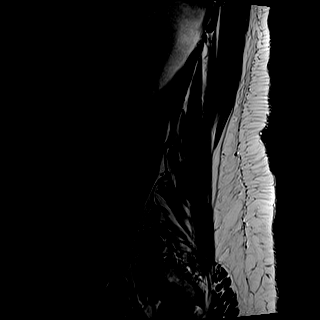
[im 4/17]
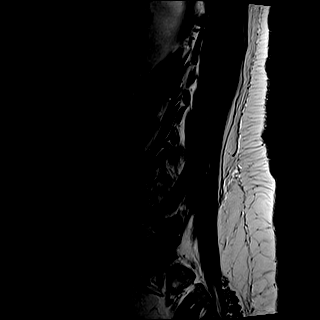
[im 7/17]
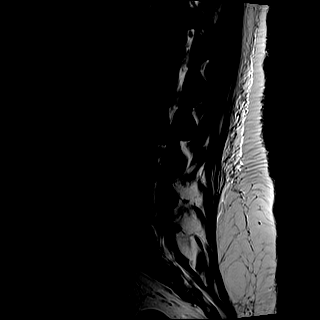
[im 10/17]
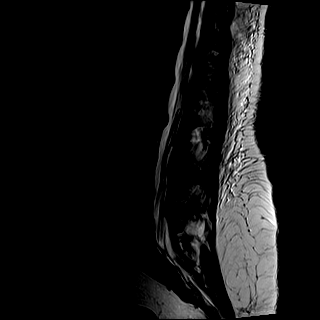
[im 13/17]
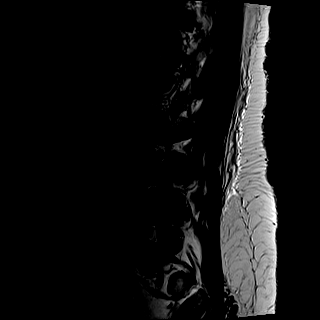
[im 17/17]
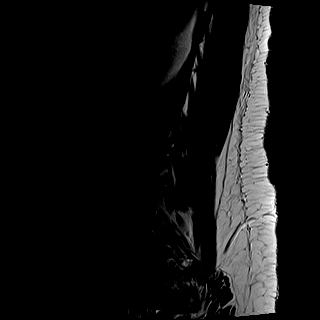

[Series 6: T1 · sagittal · 4.0mm · 0.81mm/px · 7 of 17 slices shown (1 of 2)]
[im 1/17]
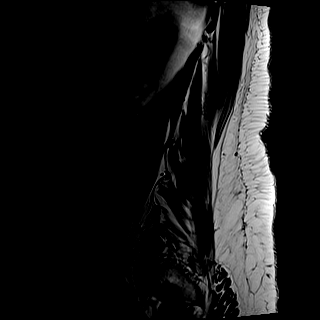
[im 3/17]
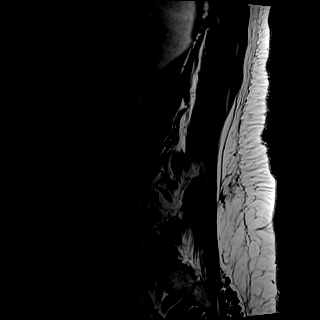
[im 6/17]
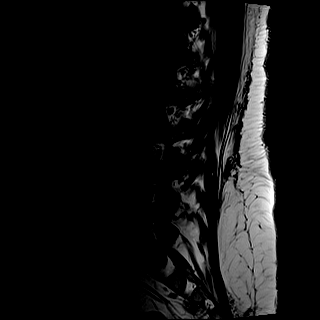
[im 9/17]
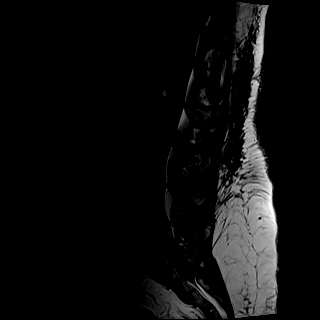
[im 11/17]
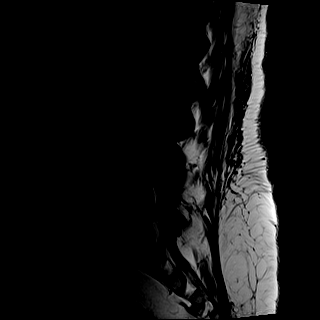
[im 14/17]
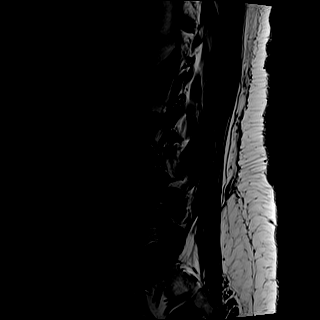
[im 17/17]
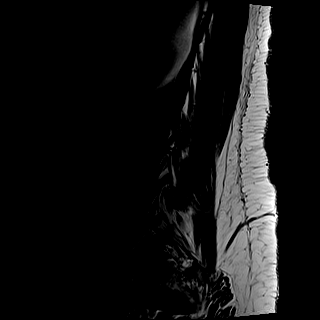

[Series 7: STIR · sagittal · 4.0mm · 0.41mm/px · 1 of 17 slices shown]
[im 1/17]
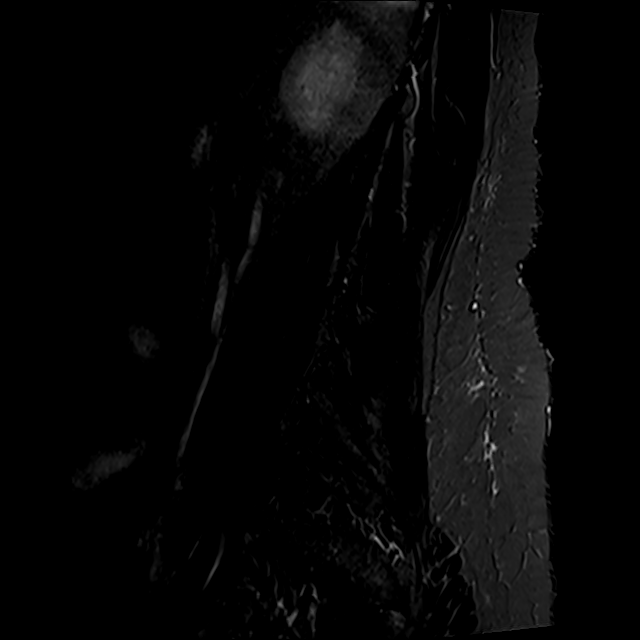

[Series 8: T2 · axial · 4.0mm · 0.78mm/px · z∈[-92,+111]mm · 8 of 36 slices shown (2 of 2)]
[im 1/36]
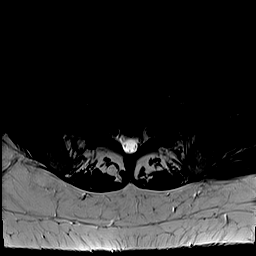
[im 6/36]
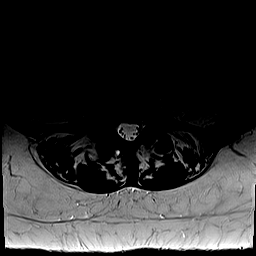
[im 11/36]
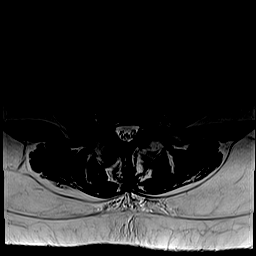
[im 17/36]
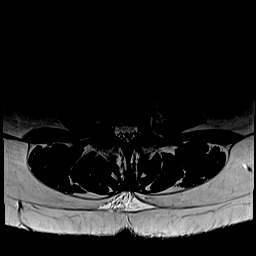
[im 19/36]
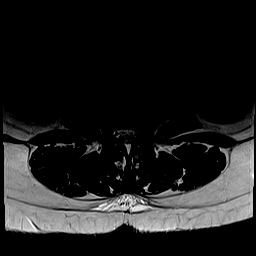
[im 25/36]
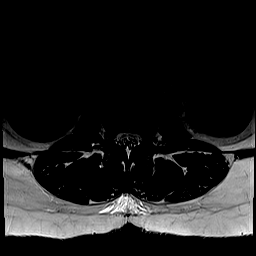
[im 30/36]
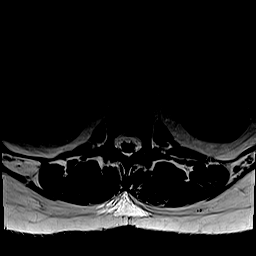
[im 36/36]
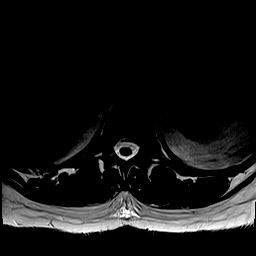

[Series 9: T1 · axial · 4.0mm · 0.39mm/px · z∈[-92,+111]mm · 8 of 36 slices shown (2 of 2)]
[im 1/36]
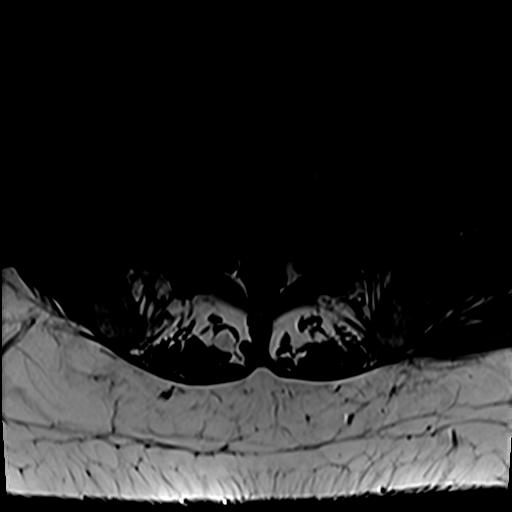
[im 6/36]
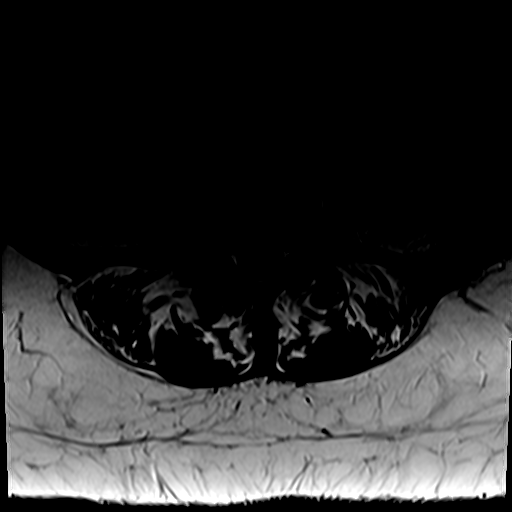
[im 11/36]
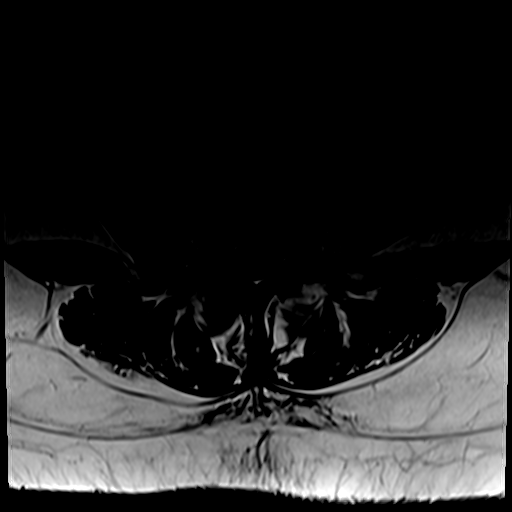
[im 17/36]
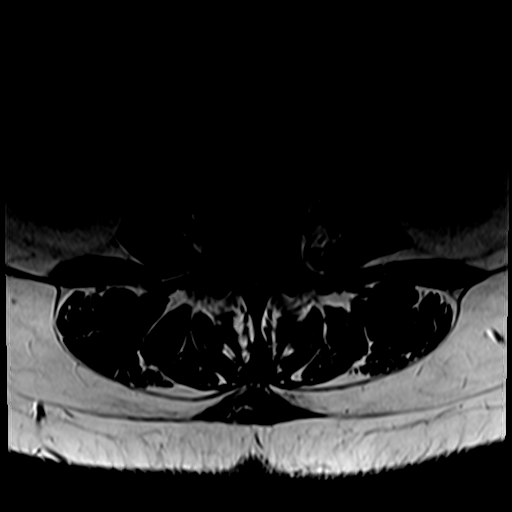
[im 19/36]
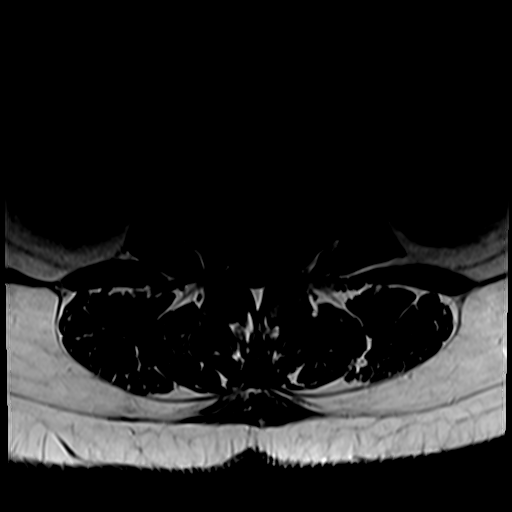
[im 25/36]
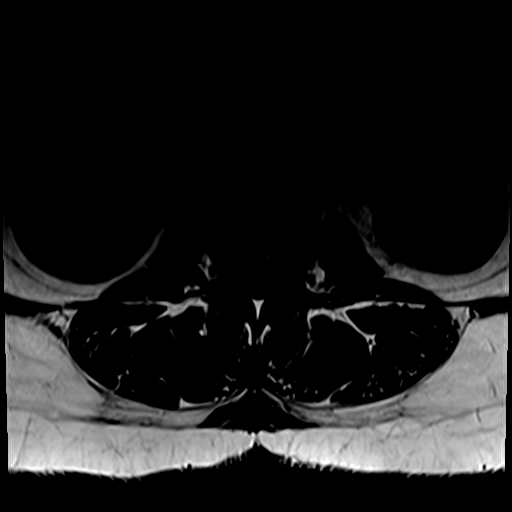
[im 30/36]
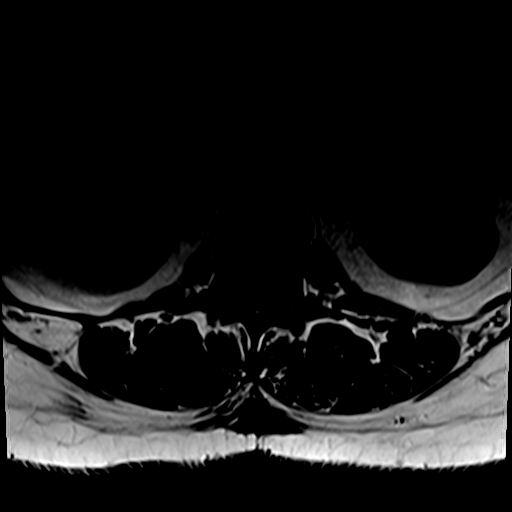
[im 36/36]
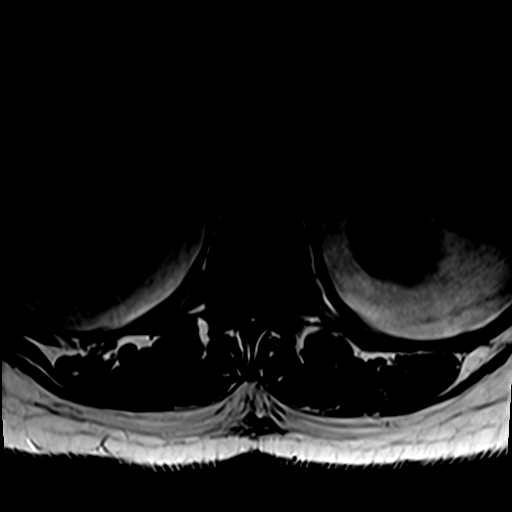

[30 of 48 positions shown; findings below may reference images not displayed]

FINDINGS: Segmentation: Standard segmentation is assumed. The inferior-most
fully formed intervertebral disc is labeled L5-S1.

Alignment: Straightening of the normal lumbar lordosis. Slight
(grade 1) retrolisthesis of L2 on L3.

Vertebrae: Degenerative/discogenic signal changes about the left
L4-L5 and L2-L3 disc spaces. No specific evidence of acute fracture
or discitis/osteomyelitis. Mildly heterogeneous bone marrow without
suspicious lesion.

Conus medullaris and cauda equina: Conus extends to the L2 level.
Conus appears normal.

Paraspinal and other soft tissues: Unremarkable.

Disc levels:

T12-L1: Mild disc bulge without significant canal or foraminal
stenosis.

L1-L2: Mild disc bulge without significant canal or foraminal
stenosis.

L2-L3: Mild disc height loss. Slight (grade 1) retrolisthesis of L2
on L3. Small broad disc bulge with superimposed right foraminal disc
protrusion. Mildly prominent dorsal epidural fat. Resulting mild
canal and subarticular recess stenosis without significant foraminal
stenosis.

L3-L4: Mild facet hypertrophy. Far lateral right foraminal disc
protrusion contacts the exiting right L3 nerve (series 8, image 23;
series 5, image 5) without significant foraminal stenosis. Mildly
prominent dorsal epidural fat. Mild canal stenosis.

L4-L5: Mild disc height loss. Mild broad disc bulge and mild facet
hypertrophy. Right far lateral disc protrusion contacts the
exiting/exited right L4 nerve (series 8, image 28; series 5, image
5) without significant foraminal stenosis. Mild bilateral foraminal
stenosis. Mildly prominent dorsal epidural fat. Mild canal stenosis.

L5-S1: No significant disc protrusion, foraminal stenosis, or canal
stenosis.
IMPRESSION: 1. Mild canal stenosis at L2-L3, L3-L4, and L4-L5.
2. Mild bilateral foraminal stenosis at L4-L5.
3. Right far lateral disc protrusions at L3-L4 and L4-L5 likely
contact the exiting/exited right L3 and L4 nerve roots respectively.
4. Degenerative disease is greatest at L2-L3 and L4-L5 with mild
disc height loss and discogenic endplate signal changes.

## 2020-10-01 ENCOUNTER — Other Ambulatory Visit: Payer: Self-pay | Admitting: Physician Assistant

## 2020-10-01 DIAGNOSIS — F40298 Other specified phobia: Secondary | ICD-10-CM

## 2020-10-01 DIAGNOSIS — R519 Headache, unspecified: Secondary | ICD-10-CM

## 2020-10-01 DIAGNOSIS — M48061 Spinal stenosis, lumbar region without neurogenic claudication: Secondary | ICD-10-CM | POA: Diagnosis not present

## 2020-10-01 DIAGNOSIS — H53149 Visual discomfort, unspecified: Secondary | ICD-10-CM

## 2020-10-01 DIAGNOSIS — G4489 Other headache syndrome: Secondary | ICD-10-CM | POA: Diagnosis not present

## 2020-10-01 DIAGNOSIS — M542 Cervicalgia: Secondary | ICD-10-CM | POA: Diagnosis not present

## 2020-10-01 DIAGNOSIS — M50222 Other cervical disc displacement at C5-C6 level: Secondary | ICD-10-CM | POA: Diagnosis not present

## 2020-10-01 DIAGNOSIS — M5136 Other intervertebral disc degeneration, lumbar region: Secondary | ICD-10-CM | POA: Diagnosis not present

## 2020-10-01 DIAGNOSIS — M5126 Other intervertebral disc displacement, lumbar region: Secondary | ICD-10-CM | POA: Diagnosis not present

## 2020-10-06 ENCOUNTER — Ambulatory Visit: Payer: Medicaid Other

## 2020-10-15 ENCOUNTER — Ambulatory Visit: Payer: Medicaid Other

## 2020-11-18 DIAGNOSIS — H53149 Visual discomfort, unspecified: Secondary | ICD-10-CM | POA: Diagnosis not present

## 2020-11-18 DIAGNOSIS — G4489 Other headache syndrome: Secondary | ICD-10-CM | POA: Diagnosis not present

## 2020-11-18 DIAGNOSIS — F40298 Other specified phobia: Secondary | ICD-10-CM | POA: Diagnosis not present

## 2020-11-18 DIAGNOSIS — R110 Nausea: Secondary | ICD-10-CM | POA: Diagnosis not present

## 2020-12-26 DIAGNOSIS — H5213 Myopia, bilateral: Secondary | ICD-10-CM | POA: Diagnosis not present
